# Patient Record
Sex: Female | Born: 1950 | Race: White | Hispanic: No | Marital: Married | Smoking: Former smoker
Health system: Southern US, Community
[De-identification: ages and names within clinical notes are randomized; demographics above are authoritative.]

## PROBLEM LIST (undated history)

## (undated) DIAGNOSIS — E079 Disorder of thyroid, unspecified: Secondary | ICD-10-CM

## (undated) DIAGNOSIS — C50919 Malignant neoplasm of unspecified site of unspecified female breast: Secondary | ICD-10-CM

## (undated) DIAGNOSIS — Z923 Personal history of irradiation: Secondary | ICD-10-CM

## (undated) DIAGNOSIS — Z9221 Personal history of antineoplastic chemotherapy: Secondary | ICD-10-CM

## (undated) HISTORY — DX: Malignant neoplasm of unspecified site of unspecified female breast: C50.919

## (undated) HISTORY — DX: Disorder of thyroid, unspecified: E07.9

---

## 2004-04-04 ENCOUNTER — Encounter (INDEPENDENT_AMBULATORY_CARE_PROVIDER_SITE_OTHER): Payer: Self-pay | Admitting: Specialist

## 2004-04-04 ENCOUNTER — Encounter: Admission: RE | Admit: 2004-04-04 | Discharge: 2004-04-04 | Payer: Self-pay | Admitting: Family Medicine

## 2004-04-07 DIAGNOSIS — C50919 Malignant neoplasm of unspecified site of unspecified female breast: Secondary | ICD-10-CM

## 2004-04-07 HISTORY — PX: BREAST LUMPECTOMY: SHX2

## 2004-04-07 HISTORY — DX: Malignant neoplasm of unspecified site of unspecified female breast: C50.919

## 2004-04-07 HISTORY — PX: CYSTECTOMY: SUR359

## 2004-04-15 ENCOUNTER — Encounter: Admission: RE | Admit: 2004-04-15 | Discharge: 2004-04-15 | Payer: Self-pay | Admitting: General Surgery

## 2004-04-16 ENCOUNTER — Encounter: Admission: RE | Admit: 2004-04-16 | Discharge: 2004-04-16 | Payer: Self-pay | Admitting: General Surgery

## 2004-04-17 ENCOUNTER — Ambulatory Visit (HOSPITAL_COMMUNITY): Admission: RE | Admit: 2004-04-17 | Discharge: 2004-04-17 | Payer: Self-pay | Admitting: General Surgery

## 2004-04-17 ENCOUNTER — Ambulatory Visit (HOSPITAL_BASED_OUTPATIENT_CLINIC_OR_DEPARTMENT_OTHER): Admission: RE | Admit: 2004-04-17 | Discharge: 2004-04-17 | Payer: Self-pay | Admitting: General Surgery

## 2004-04-17 ENCOUNTER — Encounter (INDEPENDENT_AMBULATORY_CARE_PROVIDER_SITE_OTHER): Payer: Self-pay | Admitting: *Deleted

## 2004-05-06 ENCOUNTER — Ambulatory Visit (HOSPITAL_BASED_OUTPATIENT_CLINIC_OR_DEPARTMENT_OTHER): Admission: RE | Admit: 2004-05-06 | Discharge: 2004-05-06 | Payer: Self-pay | Admitting: General Surgery

## 2004-05-06 ENCOUNTER — Encounter (INDEPENDENT_AMBULATORY_CARE_PROVIDER_SITE_OTHER): Payer: Self-pay | Admitting: *Deleted

## 2004-05-06 ENCOUNTER — Ambulatory Visit (HOSPITAL_COMMUNITY): Admission: RE | Admit: 2004-05-06 | Discharge: 2004-05-06 | Payer: Self-pay | Admitting: General Surgery

## 2004-05-14 ENCOUNTER — Ambulatory Visit (HOSPITAL_COMMUNITY): Admission: RE | Admit: 2004-05-14 | Discharge: 2004-05-14 | Payer: Self-pay | Admitting: Oncology

## 2004-05-15 ENCOUNTER — Ambulatory Visit (HOSPITAL_COMMUNITY): Admission: RE | Admit: 2004-05-15 | Discharge: 2004-05-15 | Payer: Self-pay | Admitting: Oncology

## 2004-05-16 ENCOUNTER — Ambulatory Visit (HOSPITAL_COMMUNITY): Admission: RE | Admit: 2004-05-16 | Discharge: 2004-05-16 | Payer: Self-pay | Admitting: Oncology

## 2004-05-17 ENCOUNTER — Ambulatory Visit (HOSPITAL_COMMUNITY): Admission: RE | Admit: 2004-05-17 | Discharge: 2004-05-17 | Payer: Self-pay | Admitting: Oncology

## 2004-05-17 ENCOUNTER — Encounter: Payer: Self-pay | Admitting: Cardiology

## 2004-05-17 ENCOUNTER — Ambulatory Visit: Payer: Self-pay | Admitting: Cardiology

## 2004-05-22 ENCOUNTER — Ambulatory Visit: Payer: Self-pay | Admitting: Oncology

## 2004-06-05 HISTORY — PX: OTHER SURGICAL HISTORY: SHX169

## 2004-07-02 ENCOUNTER — Ambulatory Visit: Admission: RE | Admit: 2004-07-02 | Discharge: 2004-07-02 | Payer: Self-pay | Admitting: Gynecology

## 2004-07-08 ENCOUNTER — Ambulatory Visit: Payer: Self-pay | Admitting: Oncology

## 2004-08-21 ENCOUNTER — Emergency Department (HOSPITAL_COMMUNITY): Admission: EM | Admit: 2004-08-21 | Discharge: 2004-08-21 | Payer: Self-pay | Admitting: Emergency Medicine

## 2004-08-26 ENCOUNTER — Ambulatory Visit: Payer: Self-pay | Admitting: Oncology

## 2004-09-11 ENCOUNTER — Ambulatory Visit: Admission: RE | Admit: 2004-09-11 | Discharge: 2004-10-15 | Payer: Self-pay | Admitting: Radiation Oncology

## 2004-10-14 ENCOUNTER — Ambulatory Visit: Payer: Self-pay | Admitting: Oncology

## 2004-10-24 ENCOUNTER — Ambulatory Visit (HOSPITAL_BASED_OUTPATIENT_CLINIC_OR_DEPARTMENT_OTHER): Admission: RE | Admit: 2004-10-24 | Discharge: 2004-10-24 | Payer: Self-pay | Admitting: General Surgery

## 2004-10-28 ENCOUNTER — Ambulatory Visit: Admission: RE | Admit: 2004-10-28 | Discharge: 2004-12-25 | Payer: Self-pay | Admitting: Radiation Oncology

## 2004-12-04 ENCOUNTER — Ambulatory Visit: Payer: Self-pay | Admitting: Oncology

## 2005-01-20 ENCOUNTER — Ambulatory Visit: Payer: Self-pay | Admitting: Oncology

## 2005-04-08 ENCOUNTER — Encounter: Admission: RE | Admit: 2005-04-08 | Discharge: 2005-04-08 | Payer: Self-pay | Admitting: Oncology

## 2005-05-29 ENCOUNTER — Ambulatory Visit: Payer: Self-pay | Admitting: Oncology

## 2005-07-15 ENCOUNTER — Ambulatory Visit: Payer: Self-pay | Admitting: Internal Medicine

## 2005-07-16 ENCOUNTER — Other Ambulatory Visit: Admission: RE | Admit: 2005-07-16 | Discharge: 2005-07-16 | Payer: Self-pay | Admitting: Obstetrics and Gynecology

## 2005-08-11 ENCOUNTER — Ambulatory Visit: Payer: Self-pay | Admitting: Internal Medicine

## 2005-08-12 ENCOUNTER — Ambulatory Visit: Payer: Self-pay | Admitting: Oncology

## 2005-08-21 LAB — HYPERCOAGULABLE PANEL, COMPREHENSIVE
AntiThromb III Func: 87 % (ref 75–120)
Anticardiolipin IgG: <7 [GPL'U] (ref <11)
Anticardiolipin IgM: <7 [MPL'U] (ref <10)
Beta-2 Glyco I IgG: <4 U/mL (ref <20)
Beta-2-Glycoprotein I IgA: <4 U/mL (ref <10)
Beta-2-Glycoprotein I IgM: <4 U/mL (ref <10)
Homocysteine: 17.1 umol/L — ABNORMAL HIGH (ref 4.0–15.4)
PTT Lupus Anticoagulant: 34.6 secs (ref 30.5–43.1)
Protein S Ag, Total: 86 % (ref 58–146)

## 2005-12-03 ENCOUNTER — Ambulatory Visit: Payer: Self-pay | Admitting: Oncology

## 2005-12-03 LAB — CBC WITH DIFFERENTIAL/PLATELET
Basophils Absolute: 0 10*3/uL (ref 0.0–0.1)
EOS%: 2.4 % (ref 0.0–7.0)
HGB: 13 g/dL (ref 11.6–15.9)
LYMPH%: 15.8 % (ref 14.0–48.0)
MCH: 32.9 pg (ref 26.0–34.0)
MCV: 96.9 fL (ref 81.0–101.0)
MONO%: 9.9 % (ref 0.0–13.0)
Platelets: 208 10*3/uL (ref 145–400)
RBC: 3.94 10*6/uL (ref 3.70–5.32)
RDW: 12.9 % (ref 11.3–14.5)

## 2005-12-03 LAB — COMPREHENSIVE METABOLIC PANEL
AST: 14 U/L (ref 0–37)
Albumin: 4.2 g/dL (ref 3.5–5.2)
Alkaline Phosphatase: 107 U/L (ref 39–117)
BUN: 17 mg/dL (ref 6–23)
Creatinine, Ser: 0.7 mg/dL (ref 0.40–1.20)
Glucose, Bld: 97 mg/dL (ref 70–99)
Potassium: 5 mEq/L (ref 3.5–5.3)
Total Bilirubin: 0.3 mg/dL (ref 0.3–1.2)

## 2006-04-10 ENCOUNTER — Encounter: Admission: RE | Admit: 2006-04-10 | Discharge: 2006-04-10 | Payer: Self-pay | Admitting: General Surgery

## 2006-04-10 ENCOUNTER — Encounter: Admission: RE | Admit: 2006-04-10 | Discharge: 2006-04-10 | Payer: Self-pay | Admitting: Oncology

## 2006-06-05 ENCOUNTER — Ambulatory Visit: Payer: Self-pay | Admitting: Oncology

## 2006-06-09 LAB — COMPREHENSIVE METABOLIC PANEL
AST: 16 U/L (ref 0–37)
Albumin: 4.1 g/dL (ref 3.5–5.2)
BUN: 17 mg/dL (ref 6–23)
CO2: 27 mEq/L (ref 19–32)
Calcium: 9 mg/dL (ref 8.4–10.5)
Chloride: 107 mEq/L (ref 96–112)
Potassium: 4.7 mEq/L (ref 3.5–5.3)

## 2006-06-09 LAB — CBC WITH DIFFERENTIAL/PLATELET
Basophils Absolute: 0 10*3/uL (ref 0.0–0.1)
EOS%: 2.5 % (ref 0.0–7.0)
Eosinophils Absolute: 0.1 10*3/uL (ref 0.0–0.5)
HGB: 13.4 g/dL (ref 11.6–15.9)
MCH: 33 pg (ref 26.0–34.0)
MONO#: 0.5 10*3/uL (ref 0.1–0.9)
NEUT#: 3.1 10*3/uL (ref 1.5–6.5)
RDW: 12.4 % (ref 11.3–14.5)
WBC: 5 10*3/uL (ref 3.9–10.0)
lymph#: 1.2 10*3/uL (ref 0.9–3.3)

## 2006-06-11 ENCOUNTER — Ambulatory Visit (HOSPITAL_COMMUNITY): Admission: RE | Admit: 2006-06-11 | Discharge: 2006-06-11 | Payer: Self-pay | Admitting: Oncology

## 2006-12-11 ENCOUNTER — Ambulatory Visit: Payer: Self-pay | Admitting: Oncology

## 2006-12-15 ENCOUNTER — Ambulatory Visit (HOSPITAL_COMMUNITY): Admission: RE | Admit: 2006-12-15 | Discharge: 2006-12-15 | Payer: Self-pay | Admitting: Oncology

## 2006-12-15 LAB — CBC WITH DIFFERENTIAL/PLATELET
EOS%: 1.1 % (ref 0.0–7.0)
Eosinophils Absolute: 0.1 10*3/uL (ref 0.0–0.5)
MCH: 33.5 pg (ref 26.0–34.0)
MCV: 95.1 fL (ref 81.0–101.0)
MONO%: 9.6 % (ref 0.0–13.0)
NEUT#: 4 10*3/uL (ref 1.5–6.5)
RBC: 3.88 10*6/uL (ref 3.70–5.32)
RDW: 12.8 % (ref 11.3–14.5)

## 2006-12-15 LAB — COMPREHENSIVE METABOLIC PANEL
AST: 17 U/L (ref 0–37)
Alkaline Phosphatase: 111 U/L (ref 39–117)
BUN: 15 mg/dL (ref 6–23)
Calcium: 9.8 mg/dL (ref 8.4–10.5)
Chloride: 105 mEq/L (ref 96–112)
Creatinine, Ser: 0.63 mg/dL (ref 0.40–1.20)

## 2007-04-12 ENCOUNTER — Encounter: Admission: RE | Admit: 2007-04-12 | Discharge: 2007-04-12 | Payer: Self-pay | Admitting: Surgery

## 2007-06-17 ENCOUNTER — Encounter: Admission: RE | Admit: 2007-06-17 | Discharge: 2007-06-17 | Payer: Self-pay | Admitting: Endocrinology

## 2007-11-06 ENCOUNTER — Ambulatory Visit: Payer: Self-pay | Admitting: Oncology

## 2007-11-15 LAB — CBC WITH DIFFERENTIAL/PLATELET
BASO%: 0.3 % (ref 0.0–2.0)
EOS%: 1.3 % (ref 0.0–7.0)
MCH: 32.5 pg (ref 26.0–34.0)
MCHC: 34.4 g/dL (ref 32.0–36.0)
RBC: 4.04 10*6/uL (ref 3.70–5.32)
RDW: 13.4 % (ref 11.3–14.5)
lymph#: 1.2 10*3/uL (ref 0.9–3.3)

## 2007-11-15 LAB — COMPREHENSIVE METABOLIC PANEL
ALT: 19 U/L (ref 0–35)
AST: 20 U/L (ref 0–37)
Albumin: 4.1 g/dL (ref 3.5–5.2)
Alkaline Phosphatase: 106 U/L (ref 39–117)
Calcium: 10 mg/dL (ref 8.4–10.5)
Chloride: 106 mEq/L (ref 96–112)
Potassium: 4.8 mEq/L (ref 3.5–5.3)

## 2008-01-03 ENCOUNTER — Encounter: Admission: RE | Admit: 2008-01-03 | Discharge: 2008-01-03 | Payer: Self-pay | Admitting: Endocrinology

## 2008-04-12 ENCOUNTER — Encounter: Admission: RE | Admit: 2008-04-12 | Discharge: 2008-04-12 | Payer: Self-pay | Admitting: General Surgery

## 2008-04-13 ENCOUNTER — Encounter: Admission: RE | Admit: 2008-04-13 | Discharge: 2008-04-13 | Payer: Self-pay | Admitting: Endocrinology

## 2008-04-19 ENCOUNTER — Encounter: Admission: RE | Admit: 2008-04-19 | Discharge: 2008-04-19 | Payer: Self-pay | Admitting: Surgery

## 2008-11-10 ENCOUNTER — Ambulatory Visit: Payer: Self-pay | Admitting: Oncology

## 2008-11-14 LAB — CBC WITH DIFFERENTIAL/PLATELET
Basophils Absolute: 0 10*3/uL (ref 0.0–0.1)
Eosinophils Absolute: 0.1 10*3/uL (ref 0.0–0.5)
HGB: 12.8 g/dL (ref 11.6–15.9)
MCV: 96.1 fL (ref 79.5–101.0)
MONO#: 0.8 10*3/uL (ref 0.1–0.9)
NEUT#: 3.7 10*3/uL (ref 1.5–6.5)
RDW: 13 % (ref 11.2–14.5)
lymph#: 1.5 10*3/uL (ref 0.9–3.3)

## 2008-11-14 LAB — COMPREHENSIVE METABOLIC PANEL
Albumin: 4.1 g/dL (ref 3.5–5.2)
BUN: 16 mg/dL (ref 6–23)
Calcium: 9.3 mg/dL (ref 8.4–10.5)
Chloride: 105 mEq/L (ref 96–112)
Glucose, Bld: 112 mg/dL — ABNORMAL HIGH (ref 70–99)
Potassium: 4.5 mEq/L (ref 3.5–5.3)

## 2009-01-09 ENCOUNTER — Encounter: Admission: RE | Admit: 2009-01-09 | Discharge: 2009-01-09 | Payer: Self-pay | Admitting: Endocrinology

## 2009-01-25 ENCOUNTER — Ambulatory Visit: Payer: Self-pay | Admitting: Oncology

## 2009-01-29 LAB — BASIC METABOLIC PANEL
Calcium: 9 mg/dL (ref 8.4–10.5)
Chloride: 101 mEq/L (ref 96–112)
Creatinine, Ser: 0.56 mg/dL (ref 0.40–1.20)

## 2009-04-25 ENCOUNTER — Encounter: Admission: RE | Admit: 2009-04-25 | Discharge: 2009-04-25 | Payer: Self-pay | Admitting: Surgery

## 2009-07-27 ENCOUNTER — Ambulatory Visit: Payer: Self-pay | Admitting: Oncology

## 2009-07-30 LAB — BASIC METABOLIC PANEL
BUN: 15 mg/dL (ref 6–23)
Chloride: 102 mEq/L (ref 96–112)
Creatinine, Ser: 0.61 mg/dL (ref 0.40–1.20)

## 2009-07-30 LAB — CBC WITH DIFFERENTIAL/PLATELET
BASO%: 0.5 % (ref 0.0–2.0)
EOS%: 1.7 % (ref 0.0–7.0)
HCT: 39.3 % (ref 34.8–46.6)
LYMPH%: 21 % (ref 14.0–49.7)
MCH: 32.1 pg (ref 25.1–34.0)
MCHC: 33.1 g/dL (ref 31.5–36.0)
NEUT%: 66 % (ref 38.4–76.8)
Platelets: 229 10*3/uL (ref 145–400)

## 2009-11-09 ENCOUNTER — Ambulatory Visit: Payer: Self-pay | Admitting: Oncology

## 2009-11-13 LAB — CBC WITH DIFFERENTIAL/PLATELET
EOS%: 2.5 % (ref 0.0–7.0)
Eosinophils Absolute: 0.2 10*3/uL (ref 0.0–0.5)
MCH: 33 pg (ref 25.1–34.0)
MCV: 96.4 fL (ref 79.5–101.0)
MONO%: 9.8 % (ref 0.0–14.0)
NEUT#: 4.2 10*3/uL (ref 1.5–6.5)
RBC: 3.82 10*6/uL (ref 3.70–5.45)
RDW: 13.3 % (ref 11.2–14.5)

## 2009-11-14 LAB — COMPREHENSIVE METABOLIC PANEL
ALT: 15 U/L (ref 0–35)
AST: 16 U/L (ref 0–37)
Albumin: 4 g/dL (ref 3.5–5.2)
Alkaline Phosphatase: 92 U/L (ref 39–117)
Potassium: 3.7 mEq/L (ref 3.5–5.3)
Sodium: 143 mEq/L (ref 135–145)
Total Protein: 7 g/dL (ref 6.0–8.3)

## 2010-01-16 ENCOUNTER — Encounter: Admission: RE | Admit: 2010-01-16 | Discharge: 2010-01-16 | Payer: Self-pay | Admitting: Endocrinology

## 2010-02-01 ENCOUNTER — Ambulatory Visit: Payer: Self-pay | Admitting: Oncology

## 2010-02-05 LAB — CBC WITH DIFFERENTIAL/PLATELET
BASO%: 0.4 % (ref 0.0–2.0)
EOS%: 2.7 % (ref 0.0–7.0)
Eosinophils Absolute: 0.2 10*3/uL (ref 0.0–0.5)
MCHC: 33.2 g/dL (ref 31.5–36.0)
MCV: 95.6 fL (ref 79.5–101.0)
MONO%: 9.4 % (ref 0.0–14.0)
NEUT#: 4.8 10*3/uL (ref 1.5–6.5)
RBC: 4.09 10*6/uL (ref 3.70–5.45)
RDW: 13.3 % (ref 11.2–14.5)
WBC: 7 10*3/uL (ref 3.9–10.3)

## 2010-02-06 LAB — COMPREHENSIVE METABOLIC PANEL
BUN: 14 mg/dL (ref 6–23)
Calcium: 9.8 mg/dL (ref 8.4–10.5)
Creatinine, Ser: 0.62 mg/dL (ref 0.40–1.20)
Glucose, Bld: 93 mg/dL (ref 70–99)
Potassium: 3.2 mEq/L — ABNORMAL LOW (ref 3.5–5.3)
Sodium: 140 mEq/L (ref 135–145)
Total Bilirubin: 0.7 mg/dL (ref 0.3–1.2)

## 2010-02-06 LAB — VITAMIN D 25 HYDROXY (VIT D DEFICIENCY, FRACTURES): Vit D, 25-Hydroxy: 43 ng/mL (ref 30–89)

## 2010-02-06 LAB — CANCER ANTIGEN 27.29: CA 27.29: 22 U/mL (ref 0–39)

## 2010-05-01 ENCOUNTER — Encounter
Admission: RE | Admit: 2010-05-01 | Discharge: 2010-05-01 | Payer: Self-pay | Source: Home / Self Care | Attending: Oncology | Admitting: Oncology

## 2010-08-05 ENCOUNTER — Other Ambulatory Visit: Payer: Self-pay | Admitting: Dermatology

## 2010-08-06 ENCOUNTER — Other Ambulatory Visit: Payer: Self-pay | Admitting: Oncology

## 2010-08-06 ENCOUNTER — Encounter (HOSPITAL_BASED_OUTPATIENT_CLINIC_OR_DEPARTMENT_OTHER): Payer: BC Managed Care – PPO | Admitting: Oncology

## 2010-08-06 DIAGNOSIS — M899 Disorder of bone, unspecified: Secondary | ICD-10-CM

## 2010-08-06 DIAGNOSIS — Z79899 Other long term (current) drug therapy: Secondary | ICD-10-CM

## 2010-08-06 DIAGNOSIS — C50419 Malignant neoplasm of upper-outer quadrant of unspecified female breast: Secondary | ICD-10-CM

## 2010-08-06 DIAGNOSIS — Z171 Estrogen receptor negative status [ER-]: Secondary | ICD-10-CM

## 2010-08-06 DIAGNOSIS — M810 Age-related osteoporosis without current pathological fracture: Secondary | ICD-10-CM

## 2010-08-06 LAB — BASIC METABOLIC PANEL - CANCER CENTER ONLY
Potassium: 4.6 mEq/L (ref 3.3–4.7)
Sodium: 145 mEq/L (ref 128–145)

## 2010-08-23 NOTE — Op Note (Signed)
NAME:  Julie Cortez, Julie Cortez NO.:  [id number]   MEDICAL RECORD NO.:  [id number]          PATIENT TYPE:  AMB   LOCATION:  DSC                          FACILITY:  [facility]   PHYSICIAN:  Rose Phi. Maple Hudson, M.D.   DATE OF BIRTH:  1951-03-25   DATE OF PROCEDURE:  10/24/2004  DATE OF DISCHARGE:                                 OPERATIVE REPORT   PREOPERATIVE DIAGNOSIS:  Carcinoma of the right breast.   POSTOPERATIVE DIAGNOSIS:  Carcinoma of the right breast.   OPERATION:  Removal of Port-A-Cath.   SURGEON:  Rose Phi. Maple Hudson, M.D.   ANESTHESIA:  Local.   OPERATIVE PROCEDURE:  The patient placed on the operating table and the left  upper chest prepped and draped in usual fashion. After anesthetizing the  area with 1% Xylocaine and adrenaline, a short transverse incision was made  overlying the port. The catheter was exposed, grasped and removed from the  subclavian vein. There was no bleeding.   With traction on the catheter, I divided the two sutures holding it in place  and then removed the port.   Again with no bleeding, the incision was closed with a subcuticular 4-0  Monocryl and Steri-Strips. Dressing applied. The patient then allowed to go  home.       PRY/MEDQ  D:  10/24/2004  T:  10/24/2004  Job:  [id number]

## 2010-08-23 NOTE — Consult Note (Signed)
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  She also had a Pap smear showing atypical squamous cells of uncertain  significance.  Reflexed HPV typing was not done.   The patient has had an uncomplicated postoperative course.  She denies any  abdominal pain or any other symptoms associated with the surgery.  She is  currently receiving chemotherapy under the direction of Dr. Darnelle Catalan.   PHYSICAL EXAMINATION:  VITAL SIGNS:  Weight 132 pounds, blood pressure  100/76, pulse 120.  GENERAL:  The patient is a healthy white female in no acute distress.  ABDOMEN:  Soft and nontender.  All Steri-Strips are removed, and all  incisions are found to be healing well.   IMPRESSION:  Excellent postoperative recovery.  She can return to full  levels of activity.  She should return in 6 months to have repeat Pap smear  for further evaluation of this single atypical squamous cell report.  She  will return to the care of Dr. Darnelle Catalan for continuing chemotherapy for her  breast cancer.      DC/MEDQ  D:  07/02/2004  T:  07/02/2004  Job:  [id number]   cc:   Valentino Hue. Magrinat, M.D.  [address] [facility]    [place] [address]  Fax: [phone]   L. Lupe Carney, M.D.  [address] [place]  [place] [address]  Fax: [phone]   Telford Nab, R.N.  [address]  [place], [place] [address]

## 2010-09-04 ENCOUNTER — Encounter (INDEPENDENT_AMBULATORY_CARE_PROVIDER_SITE_OTHER): Payer: Self-pay | Admitting: Surgery

## 2010-11-19 ENCOUNTER — Other Ambulatory Visit: Payer: Self-pay | Admitting: Oncology

## 2010-11-19 ENCOUNTER — Encounter (HOSPITAL_BASED_OUTPATIENT_CLINIC_OR_DEPARTMENT_OTHER): Payer: BC Managed Care – PPO | Admitting: Oncology

## 2010-11-19 DIAGNOSIS — C50419 Malignant neoplasm of upper-outer quadrant of unspecified female breast: Secondary | ICD-10-CM

## 2010-11-19 LAB — COMPREHENSIVE METABOLIC PANEL
CO2: 32 mEq/L (ref 19–32)
Glucose, Bld: 101 mg/dL — ABNORMAL HIGH (ref 70–99)
Sodium: 139 mEq/L (ref 135–145)
Total Bilirubin: 0.2 mg/dL — ABNORMAL LOW (ref 0.3–1.2)
Total Protein: 7.4 g/dL (ref 6.0–8.3)

## 2010-11-19 LAB — CBC WITH DIFFERENTIAL/PLATELET
Basophils Absolute: 0 10*3/uL (ref 0.0–0.1)
Eosinophils Absolute: 0.1 10*3/uL (ref 0.0–0.5)
HCT: 37.6 % (ref 34.8–46.6)
HGB: 12.8 g/dL (ref 11.6–15.9)
LYMPH%: 25 % (ref 14.0–49.7)
MONO#: 0.6 10*3/uL (ref 0.1–0.9)
NEUT#: 3.7 10*3/uL (ref 1.5–6.5)
NEUT%: 62 % (ref 38.4–76.8)
Platelets: 208 10*3/uL (ref 145–400)
RBC: 3.92 10*6/uL (ref 3.70–5.45)
WBC: 6 10*3/uL (ref 3.9–10.3)

## 2010-11-20 LAB — VITAMIN D 25 HYDROXY (VIT D DEFICIENCY, FRACTURES): Vit D, 25-Hydroxy: 37 ng/mL (ref 30–89)

## 2010-11-20 LAB — CANCER ANTIGEN 27.29: CA 27.29: 27 U/mL (ref 0–39)

## 2010-11-26 ENCOUNTER — Encounter (HOSPITAL_BASED_OUTPATIENT_CLINIC_OR_DEPARTMENT_OTHER): Payer: BC Managed Care – PPO | Admitting: Oncology

## 2010-11-26 DIAGNOSIS — M899 Disorder of bone, unspecified: Secondary | ICD-10-CM

## 2010-11-26 DIAGNOSIS — Z171 Estrogen receptor negative status [ER-]: Secondary | ICD-10-CM

## 2010-11-26 DIAGNOSIS — M810 Age-related osteoporosis without current pathological fracture: Secondary | ICD-10-CM

## 2010-11-26 DIAGNOSIS — C50419 Malignant neoplasm of upper-outer quadrant of unspecified female breast: Secondary | ICD-10-CM

## 2011-01-14 ENCOUNTER — Other Ambulatory Visit: Payer: Self-pay | Admitting: Endocrinology

## 2011-01-14 DIAGNOSIS — E049 Nontoxic goiter, unspecified: Secondary | ICD-10-CM

## 2011-01-28 ENCOUNTER — Ambulatory Visit
Admission: RE | Admit: 2011-01-28 | Discharge: 2011-01-28 | Disposition: A | Discharge status: Home / Self Care | Payer: BC Managed Care – PPO | Source: Ambulatory Visit | Attending: Endocrinology | Admitting: Endocrinology

## 2011-01-28 DIAGNOSIS — E049 Nontoxic goiter, unspecified: Secondary | ICD-10-CM

## 2011-03-24 ENCOUNTER — Other Ambulatory Visit: Payer: Self-pay | Admitting: Oncology

## 2011-03-24 DIAGNOSIS — Z9889 Other specified postprocedural states: Secondary | ICD-10-CM

## 2011-03-24 DIAGNOSIS — C50919 Malignant neoplasm of unspecified site of unspecified female breast: Secondary | ICD-10-CM

## 2011-05-01 ENCOUNTER — Encounter (INDEPENDENT_AMBULATORY_CARE_PROVIDER_SITE_OTHER): Payer: Self-pay | Admitting: Surgery

## 2011-05-05 ENCOUNTER — Ambulatory Visit
Admission: RE | Admit: 2011-05-05 | Discharge: 2011-05-05 | Disposition: A | Discharge status: Home / Self Care | Payer: BC Managed Care – PPO | Source: Ambulatory Visit | Attending: Oncology | Admitting: Oncology

## 2011-05-05 DIAGNOSIS — C50919 Malignant neoplasm of unspecified site of unspecified female breast: Secondary | ICD-10-CM

## 2011-05-05 DIAGNOSIS — Z9889 Other specified postprocedural states: Secondary | ICD-10-CM

## 2011-05-09 ENCOUNTER — Encounter (INDEPENDENT_AMBULATORY_CARE_PROVIDER_SITE_OTHER): Payer: Self-pay | Admitting: General Surgery

## 2011-05-09 DIAGNOSIS — C50911 Malignant neoplasm of unspecified site of right female breast: Secondary | ICD-10-CM | POA: Insufficient documentation

## 2011-05-10 ENCOUNTER — Telehealth: Payer: Self-pay | Admitting: Oncology

## 2011-05-10 NOTE — Telephone Encounter (Signed)
pt rtn call and confirmed appts for march and june 2013

## 2011-05-14 ENCOUNTER — Ambulatory Visit (INDEPENDENT_AMBULATORY_CARE_PROVIDER_SITE_OTHER): Payer: BC Managed Care – PPO | Admitting: Surgery

## 2011-05-14 ENCOUNTER — Encounter (INDEPENDENT_AMBULATORY_CARE_PROVIDER_SITE_OTHER): Payer: Self-pay | Admitting: Surgery

## 2011-05-14 VITALS — BP 120/82 | HR 80 | Temp 97.2°F | Resp 16 | Ht 63.75 in | Wt 162.6 lb

## 2011-05-14 DIAGNOSIS — Z853 Personal history of malignant neoplasm of breast: Secondary | ICD-10-CM

## 2011-05-14 NOTE — Progress Notes (Signed)
NAME: Julie Cortez       DOB: 1950-08-11           DATE: 05/14/2011       MRN: [id number]   Julie Cortez is a 61 y.o.Marland Kitchenfemale who presents for routine followup of her Right breast cancer, stage II diagnosed in 2005 and treated with lumpectomy and radiation. She has no problems or concerns on either side.  PFSH: She has had no significant changes since the last visit here.  ROS: There have been no significant changes since the last visit here  EXAM: General: The patient is alert, oriented, generally healty appearing, NAD. Mood and affect are normal.  Breasts:  Right breast shows deformity and loss of tissue at the lumpectomy site but no evidence of a recurrence. The left side is normal  Lymphatics: She has no axillary or supraclavicular adenopathy on either side.  Extremities: Full ROM of the surgical side with no lymphedema noted.  Data Reviewed: Mammogram last week is negative  Impression: Doing well, with no evidence of recurrent cancer or new cancer  Plan: Will continue to follow up on an annual basis here.

## 2011-07-01 ENCOUNTER — Other Ambulatory Visit: Payer: Self-pay | Admitting: Medical Oncology

## 2011-07-01 ENCOUNTER — Other Ambulatory Visit: Payer: Self-pay | Admitting: Oncology

## 2011-07-01 ENCOUNTER — Ambulatory Visit (HOSPITAL_BASED_OUTPATIENT_CLINIC_OR_DEPARTMENT_OTHER): Payer: BC Managed Care – PPO

## 2011-07-01 VITALS — BP 104/69 | HR 79 | Temp 97.0°F

## 2011-07-01 DIAGNOSIS — Z853 Personal history of malignant neoplasm of breast: Secondary | ICD-10-CM

## 2011-07-01 MED ORDER — SODIUM CHLORIDE 0.9 % IV SOLN
Freq: Once | INTRAVENOUS | Status: AC
  Administered 2011-07-01: 16:00:00 via INTRAVENOUS

## 2011-07-01 MED ORDER — ZOLEDRONIC ACID 4 MG/100ML IV SOLN
4.0000 mg | Freq: Once | INTRAVENOUS | Status: AC
  Administered 2011-07-01: 4 mg via INTRAVENOUS
  Filled 2011-07-01: qty 100

## 2011-07-15 ENCOUNTER — Other Ambulatory Visit: Payer: Self-pay | Admitting: Gynecology

## 2011-08-19 ENCOUNTER — Other Ambulatory Visit: Payer: Self-pay | Admitting: Dermatology

## 2011-09-16 ENCOUNTER — Other Ambulatory Visit (HOSPITAL_BASED_OUTPATIENT_CLINIC_OR_DEPARTMENT_OTHER): Payer: BC Managed Care – PPO | Admitting: Lab

## 2011-09-16 DIAGNOSIS — Z171 Estrogen receptor negative status [ER-]: Secondary | ICD-10-CM

## 2011-09-16 DIAGNOSIS — M899 Disorder of bone, unspecified: Secondary | ICD-10-CM

## 2011-09-16 DIAGNOSIS — M810 Age-related osteoporosis without current pathological fracture: Secondary | ICD-10-CM

## 2011-09-16 DIAGNOSIS — C50419 Malignant neoplasm of upper-outer quadrant of unspecified female breast: Secondary | ICD-10-CM

## 2011-09-16 LAB — CBC WITH DIFFERENTIAL/PLATELET
BASO%: 0.7 % (ref 0.0–2.0)
Basophils Absolute: 0 10*3/uL (ref 0.0–0.1)
EOS%: 2.9 % (ref 0.0–7.0)
HCT: 37.5 % (ref 34.8–46.6)
HGB: 12.4 g/dL (ref 11.6–15.9)
MCH: 32.3 pg (ref 25.1–34.0)
MCHC: 33 g/dL (ref 31.5–36.0)
MCV: 98.1 fL (ref 79.5–101.0)
MONO%: 9.1 % (ref 0.0–14.0)
NEUT%: 65.7 % (ref 38.4–76.8)
RDW: 13.3 % (ref 11.2–14.5)

## 2011-09-16 LAB — COMPREHENSIVE METABOLIC PANEL
ALT: 20 U/L (ref 0–35)
AST: 20 U/L (ref 0–37)
Alkaline Phosphatase: 96 U/L (ref 39–117)
BUN: 13 mg/dL (ref 6–23)
Creatinine, Ser: 0.54 mg/dL (ref 0.50–1.10)
Total Bilirubin: 0.2 mg/dL — ABNORMAL LOW (ref 0.3–1.2)

## 2011-09-23 ENCOUNTER — Ambulatory Visit (HOSPITAL_BASED_OUTPATIENT_CLINIC_OR_DEPARTMENT_OTHER): Payer: BC Managed Care – PPO | Admitting: Oncology

## 2011-09-23 ENCOUNTER — Telehealth: Payer: Self-pay | Admitting: Oncology

## 2011-09-23 VITALS — BP 113/70 | HR 71 | Temp 97.6°F | Ht 63.75 in | Wt 161.3 lb

## 2011-09-23 DIAGNOSIS — E559 Vitamin D deficiency, unspecified: Secondary | ICD-10-CM

## 2011-09-23 DIAGNOSIS — Z853 Personal history of malignant neoplasm of breast: Secondary | ICD-10-CM

## 2011-09-23 NOTE — Telephone Encounter (Signed)
lmonvm adviisng the pt of her June 2014 appts °

## 2011-09-23 NOTE — Progress Notes (Signed)
ID: Julie Cortez   DOB: May 28, 1950  MR#: [id number]  CSN#:[id number]  HISTORY OF PRESENT ILLNESS: Julie Cortez had neglected her health maintenance and she had not had a mammogram  before Christmas 2005, when she noted a mass in her right breast. She eventually brought this to Dr. Quita Skye attention (she did not have a primary doctor at that time, but Dr. Clovis Riley takes care of her husband, Larita Fife) and he set her up for mammography on April 04, 2004. She had a palpable mass at the 12 o'clock position in the right breast and by mammogram, this was spiculated without a malignant type microcalcification. The ultrasound showed the mass to be approximately 2.1 cm. Biopsy was performed the same day and showed ([id number]) invasive mammary carcinoma, which was ER/PR and HER-2 negative.    With this information, the patient was referred to Dr. Francina Ames who on January 11 proceed to a right lumpectomy with sentinel lymph node biopsy. That report ([id number]) shows a multifocal tumor, the largest mass being 4.2 cm, two other masses being 1.2 and 0.7 mm, grade 3, with  0 of 2 lymph nodes involved. There was extensive lymphovascular invasion and the margins were positive.  Accordingly, Dr. Maple Hudson went back on January 30th and the final margins were negative, but still close at 1 mm superiorly.  There was also extensive LVI noted again in this sample. Julie Cortez's subsequent history is as detailed below.  INTERVAL HISTORY: She returns today with her husband Larita Fife for followup of her remote breast cancer. The interval history is chiefly significant for her oldest daughter planning to get married later this year. 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  REVIEW OF SYSTEMS: Jasalyn is doing very well from a breast cancer point of view, and there are no symptoms suggestive of disease activity. She does have her psoriasis issues, and [facility] follows her for that. Her hypothyroidism and other medical issues  are dealt with by Dr. Clovis Riley. Sometimes when she walks upstairs a lot in the hospital, or when she arrives in the car for long time, she gets pain in the right hip. This disappears with activity. A detailed review of systems today was entirely negative. She is exercising regularly, chiefly by walking.  PAST MEDICAL HISTORY: Past Medical History  Diagnosis Date  . Thyroid disease     goiter  . Cancer 2006    right Breast  Significant for status post Port placement, psoriasis, history of postpartum pulmonary embolus, history of tobacco abuse, history of Clomid use in the late 70s, history of anxiety /depression and history of left breast cyst aspiration.   PAST SURGICAL HISTORY: Past Surgical History  Procedure Date  . Laproscopic cyst removal March 2006  . Cystectomy 2006  . Breast lumpectomy 2006    right breast    FAMILY HISTORY Family History  Problem Relation Age of Onset  . Cancer Father     Lung  . Cancer Mother     brain  Her father died from widely disseminated cancer at the age of 45. They really do not know what the primary site was. It was quite widespread at the time of diagnosis and he died within six days of diagnosis. Her mother had superficial bladder cancer and then developed a central nervous system non-Hodgkin's lymphoma.  She died within six months of diagnosis.  The patient has one brother and one sister.  There is no history of breast cancer in the family and no history of  ovarian cancer in the family.  GYNECOLOGIC HISTORY: She is G3, P3. First delivery age 18, last menstrual period 18. She did not have significant problems with hot flashes and never took hormone replacement therapy.   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  Her husband, Larita Fife, is present today. He owns and runs a Loss adjuster, chartered. Their three children are Florentina Addison, who lives in  [place] and is in Airline pilot; Fallon, who lives in [place] and works for [employer]; and [place] [employer], their son 21 who is junior in [place] and plays [employer] for the school.  She is a member of [employer].    ADVANCED DIRECTIVES: in place  HEALTH MAINTENANCE: History  Substance Use Topics  . Smoking status: Former Smoker    Quit date: 04/07/2004  . Smokeless tobacco: Not on file  . Alcohol Use: Yes     Colonoscopy:  PAP:  Bone density:  Lipid panel:  No Known Allergies  Current Outpatient Prescriptions  Medication Sig Dispense Refill  . acitretin (SORIATANE) 25 MG capsule Take 25 mg by mouth daily before breakfast.      . acyclovir (ZOVIRAX) 800 MG tablet Take 800 mg by mouth 2 (two) times daily.      Marland Kitchen CALCIUM-VITAMIN D PO Take 500 mg by mouth 2 (two) times daily.        . ciprofloxacin (CIPRO) 750 MG tablet Take 750 mg by mouth 2 (two) times daily.      Marland Kitchen levothyroxine (SYNTHROID, LEVOTHROID) 50 MCG tablet Take 50 mcg by mouth daily.        . methotrexate (RHEUMATREX) 2.5 MG tablet Take 2.5 mg by mouth 3 (three) times a week. Caution:Chemotherapy. Protect from light.      . Zoledronic Acid (ZOMETA IV) Inject into the vein. Twice a year         OBJECTIVE: Middle-aged white woman who appears well Filed Vitals:   09/23/11 1530  BP: 113/70  Pulse: 71  Temp: 97.6 F (36.4 C)     Body mass index is 27.90 kg/(m^2).    ECOG FS: 0 Sclerae unicteric Oropharynx clear No cervical or supraclavicular adenopathy Lungs no rales or rhonchi Heart regular rate and rhythm Abd benign MSK no focal spinal tenderness, no peripheral edema Neuro: nonfocal Breasts: The right breast is status post lumpectomy and radiation; there is no evidence of local recurrence. The left breast is unremarkable  LAB RESULTS: Lab Results  Component Value Date   WBC 5.7 09/16/2011   NEUTROABS 3.7 09/16/2011   HGB 12.4 09/16/2011   HCT 37.5 09/16/2011   MCV 98.1 09/16/2011   PLT 249 09/16/2011       Chemistry      Component Value Date/Time   NA 143 09/16/2011 1536   NA 145 08/06/2010 1517   K 3.4* 09/16/2011 1536   K 4.6 08/06/2010 1517   CL 106 09/16/2011 1536   CL 99 08/06/2010 1517   CO2 30 09/16/2011 1536   CO2 31 08/06/2010 1517   BUN 13 09/16/2011 1536   BUN 17 08/06/2010 1517   CREATININE 0.54 09/16/2011 1536   CREATININE 0.6 08/06/2010 1517      Component Value Date/Time   CALCIUM 9.0 09/16/2011 1536   CALCIUM 9.6 08/06/2010 1517   ALKPHOS 96 09/16/2011 1536   AST 20 09/16/2011 1536   ALT 20 09/16/2011 1536   BILITOT 0.2* 09/16/2011 1536  Lab Results  Component Value Date   LABCA2 25 09/16/2011    No components found with this basename: [id number]    No results found for this basename: INR:1;PROTIME:1 in the last 168 hours  Urinalysis No results found for this basename: colorurine, appearanceur, labspec, phurine, glucoseu, hgbur, bilirubinur, ketonesur, proteinur, urobilinogen, nitrite, leukocytesur    STUDIES: Mammography January 2013 was unremarkable. Bone density will be repeated January of next year.  ASSESSMENT: 61 y.o. 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    PLAN: We have been giving her zoledronic acid, but at this point I think it would be prudent to wait until the results of the upcoming bone density next year, so we are not scheduling that until we get that result. As far as the right hip discomfort is concerned I have suggested range of motion exercises. She will also discuss that with a trainer's at the gym where she will be going. She would like to continue to see me on a yearly basis and of course that is agreeable. We will do lab and physical exam only at the next visit. Of course she will have her  mammography again at January of next year. She knows to call for any other issues that may develop before the next visit.   Deno Sida C    09/23/2011

## 2011-10-14 ENCOUNTER — Other Ambulatory Visit: Payer: Self-pay | Admitting: *Deleted

## 2011-10-14 ENCOUNTER — Telehealth: Payer: Self-pay | Admitting: *Deleted

## 2011-10-14 DIAGNOSIS — Z853 Personal history of malignant neoplasm of breast: Secondary | ICD-10-CM

## 2011-10-14 NOTE — Telephone Encounter (Signed)
called patient mammogram and bone density 05-06-2012 starting at 10:00am at the breast center

## 2012-04-23 ENCOUNTER — Encounter (INDEPENDENT_AMBULATORY_CARE_PROVIDER_SITE_OTHER): Payer: Self-pay | Admitting: Surgery

## 2012-05-06 ENCOUNTER — Ambulatory Visit
Admission: RE | Admit: 2012-05-06 | Discharge: 2012-05-06 | Disposition: A | Discharge status: Home / Self Care | Payer: BC Managed Care – PPO | Source: Ambulatory Visit | Attending: Oncology | Admitting: Oncology

## 2012-05-06 ENCOUNTER — Other Ambulatory Visit: Payer: Self-pay | Admitting: Oncology

## 2012-05-06 DIAGNOSIS — Z853 Personal history of malignant neoplasm of breast: Secondary | ICD-10-CM

## 2012-06-10 ENCOUNTER — Other Ambulatory Visit: Payer: Self-pay | Admitting: Endocrinology

## 2012-09-21 ENCOUNTER — Other Ambulatory Visit (HOSPITAL_BASED_OUTPATIENT_CLINIC_OR_DEPARTMENT_OTHER): Payer: BC Managed Care – PPO | Admitting: Lab

## 2012-09-21 DIAGNOSIS — Z853 Personal history of malignant neoplasm of breast: Secondary | ICD-10-CM

## 2012-09-21 DIAGNOSIS — E559 Vitamin D deficiency, unspecified: Secondary | ICD-10-CM

## 2012-09-21 LAB — CBC WITH DIFFERENTIAL/PLATELET
BASO%: 0.7 % (ref 0.0–2.0)
Eosinophils Absolute: 0.2 10*3/uL (ref 0.0–0.5)
MCHC: 33.8 g/dL (ref 31.5–36.0)
MONO#: 0.9 10*3/uL (ref 0.1–0.9)
NEUT#: 3.9 10*3/uL (ref 1.5–6.5)
RBC: 3.65 10*6/uL — ABNORMAL LOW (ref 3.70–5.45)
RDW: 15.4 % — ABNORMAL HIGH (ref 11.2–14.5)
WBC: 6.5 10*3/uL (ref 3.9–10.3)
lymph#: 1.4 10*3/uL (ref 0.9–3.3)

## 2012-09-21 LAB — COMPREHENSIVE METABOLIC PANEL (CC13)
ALT: 33 U/L (ref 0–55)
AST: 29 U/L (ref 5–34)
CO2: 28 mEq/L (ref 22–29)
Calcium: 9.2 mg/dL (ref 8.4–10.4)
Chloride: 106 mEq/L (ref 98–107)
Sodium: 142 mEq/L (ref 136–145)
Total Protein: 6.9 g/dL (ref 6.4–8.3)

## 2012-09-22 LAB — VITAMIN D 25 HYDROXY (VIT D DEFICIENCY, FRACTURES): Vit D, 25-Hydroxy: 24 ng/mL — ABNORMAL LOW (ref 30–89)

## 2012-09-27 ENCOUNTER — Telehealth: Payer: Self-pay | Admitting: *Deleted

## 2012-09-27 ENCOUNTER — Ambulatory Visit (HOSPITAL_BASED_OUTPATIENT_CLINIC_OR_DEPARTMENT_OTHER): Payer: BC Managed Care – PPO | Admitting: Oncology

## 2012-09-27 VITALS — BP 106/69 | HR 85 | Temp 98.2°F | Resp 20 | Ht 63.75 in | Wt 147.5 lb

## 2012-09-27 DIAGNOSIS — Z853 Personal history of malignant neoplasm of breast: Secondary | ICD-10-CM

## 2012-09-27 NOTE — Progress Notes (Signed)
ID: Julie Cortez   DOB: [birth year]  MR#: [id number]  CSN#:[id number]  PCP: Julie Stabile, MD GYN: SU:  OTHER MD: Julie Cortez   HISTORY OF PRESENT ILLNESS: Julie Cortez had neglected her health maintenance and she had not had a mammogram  before Christmas 2005, when she noted a mass in her right breast. She eventually brought this to Julie Cortez attention (she did not have a primary doctor at that time, but Julie Cortez takes care of her husband, Julie Cortez) and he set her up for mammography on April 04, 2004. She had a palpable mass at the 12 o'clock position in the right breast and by mammogram, this was spiculated without a malignant type microcalcification. The ultrasound showed the mass to be approximately 2.1 cm. Biopsy was performed the same day and showed ([id number]) invasive mammary carcinoma, which was ER/PR and HER-2 negative.    With this information, the patient was referred to Julie Cortez who on January 11 proceed to a right lumpectomy with sentinel lymph node biopsy. That report ([id number]) shows a multifocal tumor, the largest mass being 4.2 cm, two other masses being 1.2 and 0.7 mm, grade 3, with  0 of 2 lymph nodes involved. There was extensive lymphovascular invasion and the margins were positive.  Accordingly, Julie Cortez went back on January 30th and the final margins were negative, but still close at 1 mm superiorly.  There was also extensive LVI noted again in this sample. Julie Cortez's subsequent history is as detailed below.  INTERVAL HISTORY: She returns today for followup of her remote breast cancer. . She is expecting her first grandchild. She is excercising 5 days a week at the [facility].   REVIEW OF SYSTEMS: [name] feels terrific. Her only active problem is psoriasis, and she is tolerating the MTX well, with close dermatologic follow-up. She is pleased at having brought her weight under control and is trying to decide on 135-140 as her ultimate goal. A detailed  review of systems today was otehrwise negative  PAST MEDICAL HISTORY: Past Medical History  Diagnosis Date  . Thyroid disease     goiter  . Cancer 2006    right Breast  Significant for status post Port placement, psoriasis, history of postpartum pulmonary embolus, history of tobacco abuse, history of Clomid use in the late 70s, history of anxiety /depression and history of left breast cyst aspiration.   PAST SURGICAL HISTORY: Past Surgical History  Procedure Laterality Date  . Laproscopic cyst removal  March 2006  . Cystectomy  2006  . Breast lumpectomy  2006    right breast    FAMILY HISTORY Family History  Problem Relation Age of Onset  . Cancer Father     Lung  . Cancer Mother     brain  Her father died from widely disseminated cancer at the age of 31. They really do not know what the primary site was. It was quite widespread at the time of diagnosis and he died within six days of diagnosis. Her mother had superficial bladder cancer and then developed a central nervous system non-Hodgkin's lymphoma.  She died within six months of diagnosis.  The patient has one brother and one sister.  There is no history of breast cancer in the family and no history of ovarian cancer in the family.  GYNECOLOGIC HISTORY: She is G3, P3. First delivery age 60, last menstrual period 53. She did not have significant problems with hot flashes and never took hormone replacement therapy.  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  Her husband, Julie Cortez, is present today. He owns and runs a Loss adjuster, chartered. Their three children are Julie Cortez, who lives in [place] and is in Airline pilot; Julie Cortez, who lives in [place] and works for [employer]; and Julie Cortez, their son 21 who is junior in [place] and plays [employer] for the school.  She is a member of [employer].    ADVANCED DIRECTIVES: in  place  HEALTH MAINTENANCE: History  Substance Use Topics  . Smoking status: Former Smoker    Quit date: 04/07/2004  . Smokeless tobacco: Not on file  . Alcohol Use: Yes     Colonoscopy:  PAP:  Bone density:  Lipid panel:  No Known Allergies  Current Outpatient Prescriptions  Medication Sig Dispense Refill  . acitretin (SORIATANE) 25 MG capsule Take 25 mg by mouth daily before breakfast.      . acyclovir (ZOVIRAX) 800 MG tablet Take 800 mg by mouth 2 (two) times daily.      Marland Kitchen CALCIUM-VITAMIN D PO Take 500 mg by mouth 2 (two) times daily.        . ciprofloxacin (CIPRO) 750 MG tablet Take 750 mg by mouth 2 (two) times daily.      Marland Kitchen levothyroxine (SYNTHROID, LEVOTHROID) 50 MCG tablet Take 50 mcg by mouth daily.        . methotrexate (RHEUMATREX) 2.5 MG tablet Take 2.5 mg by mouth 3 (three) times a week. Caution:Chemotherapy. Protect from light.      . Zoledronic Acid (ZOMETA IV) Inject into the vein. Twice a year        No current facility-administered medications for this visit.    OBJECTIVE: Middle-aged white woman who appears well Filed Vitals:   09/27/12 1140  BP: 106/69  Pulse: 85  Temp: 98.2 F (36.8 C)  Resp: 20     Body mass index is 25.53 kg/(m^2).    ECOG FS: 0 Sclerae unicteric Oropharynx clear No cervical or supraclavicular adenopathy Lungs no rales or rhonchi Heart regular rate and rhythm Abd benign MSK no focal spinal tenderness, no peripheral edema Neuro: nonfocal, well-oriented, positive affect Breasts: The right breast is status post lumpectomy and radiation; there is no evidence of local recurrence. The right axilla is benign. The left breast is unremarkable  LAB RESULTS: Lab Results  Component Value Date   WBC 6.5 09/21/2012   NEUTROABS 3.9 09/21/2012   HGB 12.5 09/21/2012   HCT 37.0 09/21/2012   MCV 101.5* 09/21/2012   PLT 213 09/21/2012      Chemistry      Component Value Date/Time   NA 142 09/21/2012 1503   NA 143 09/16/2011 1536   NA 145  08/06/2010 1517   K 3.8 09/21/2012 1503   K 3.4* 09/16/2011 1536   K 4.6 08/06/2010 1517   CL 106 09/21/2012 1503   CL 106 09/16/2011 1536   CL 99 08/06/2010 1517   CO2 28 09/21/2012 1503   CO2 30 09/16/2011 1536   CO2 31 08/06/2010 1517   BUN 12.9 09/21/2012 1503   BUN 13 09/16/2011 1536   BUN 17 08/06/2010 1517   CREATININE 0.7 09/21/2012 1503   CREATININE 0.54 09/16/2011 1536   CREATININE 0.6 08/06/2010 1517      Component Value Date/Time   CALCIUM 9.2 09/21/2012 1503   CALCIUM 9.0 09/16/2011 1536   CALCIUM 9.6 08/06/2010 1517  ALKPHOS 104 09/21/2012 1503   ALKPHOS 96 09/16/2011 1536   AST 29 09/21/2012 1503   AST 20 09/16/2011 1536   ALT 33 09/21/2012 1503   ALT 20 09/16/2011 1536   BILITOT 0.37 09/21/2012 1503   BILITOT 0.2* 09/16/2011 1536       Lab Results  Component Value Date   LABCA2 25 09/16/2011    No components found with this basename: [id number]    No results found for this basename: INR,  in the last 168 hours  Urinalysis No results found for this basename: colorurine,  appearanceur,  labspec,  phurine,  glucoseu,  hgbur,  bilirubinur,  ketonesur,  proteinur,  urobilinogen,  nitrite,  leukocytesur    STUDIES: Bone density this year was normal at the spine, showed moderate osteopenia at the femoral neck  ASSESSMENT: 62 y.o. 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    PLAN: I am comfortable holding off on further zometa and continuing her excellent exercise program together with vit D and calcium supplementation. I have ordered her JAN 2015 mammogram and set her up for return here in one year. Curl would like to be followed here indefinitely.) She knows to call for any problems that may develop before the next  visit.  Cecely Rengel C    09/27/2012

## 2012-09-27 NOTE — Telephone Encounter (Signed)
appts made and printed...td 

## 2012-09-28 NOTE — Addendum Note (Signed)
Addended by: Billey Co on: 09/28/2012 05:24 PM   Modules accepted: Orders

## 2013-02-15 ENCOUNTER — Ambulatory Visit
Admission: RE | Admit: 2013-02-15 | Discharge: 2013-02-15 | Disposition: A | Discharge status: Home / Self Care | Payer: BC Managed Care – PPO | Source: Ambulatory Visit | Attending: Endocrinology | Admitting: Endocrinology

## 2013-02-15 DIAGNOSIS — E049 Nontoxic goiter, unspecified: Secondary | ICD-10-CM

## 2013-02-16 ENCOUNTER — Other Ambulatory Visit: Payer: BC Managed Care – PPO

## 2013-05-09 ENCOUNTER — Ambulatory Visit
Admission: RE | Admit: 2013-05-09 | Discharge: 2013-05-09 | Disposition: A | Discharge status: Home / Self Care | Payer: BC Managed Care – PPO | Source: Ambulatory Visit | Attending: Oncology | Admitting: Oncology

## 2013-05-09 DIAGNOSIS — Z853 Personal history of malignant neoplasm of breast: Secondary | ICD-10-CM

## 2013-07-05 ENCOUNTER — Telehealth: Payer: Self-pay | Admitting: Oncology

## 2013-07-05 NOTE — Telephone Encounter (Signed)
, °

## 2013-09-20 ENCOUNTER — Other Ambulatory Visit: Payer: BC Managed Care – PPO

## 2013-09-27 ENCOUNTER — Encounter: Payer: BC Managed Care – PPO | Admitting: Oncology

## 2013-11-28 ENCOUNTER — Other Ambulatory Visit: Payer: BC Managed Care – PPO

## 2013-11-29 ENCOUNTER — Other Ambulatory Visit (HOSPITAL_BASED_OUTPATIENT_CLINIC_OR_DEPARTMENT_OTHER): Payer: BC Managed Care – PPO

## 2013-11-29 DIAGNOSIS — Z853 Personal history of malignant neoplasm of breast: Secondary | ICD-10-CM

## 2013-11-29 LAB — COMPREHENSIVE METABOLIC PANEL (CC13)
ALBUMIN: 3.4 g/dL — AB (ref 3.5–5.0)
ALT: 14 U/L (ref 0–55)
ANION GAP: 7 meq/L (ref 3–11)
AST: 20 U/L (ref 5–34)
Alkaline Phosphatase: 105 U/L (ref 40–150)
BUN: 12.1 mg/dL (ref 7.0–26.0)
CALCIUM: 8.9 mg/dL (ref 8.4–10.4)
CHLORIDE: 108 meq/L (ref 98–109)
CO2: 29 mEq/L (ref 22–29)
Creatinine: 0.7 mg/dL (ref 0.6–1.1)
GLUCOSE: 115 mg/dL (ref 70–140)
POTASSIUM: 3.8 meq/L (ref 3.5–5.1)
Sodium: 144 mEq/L (ref 136–145)
Total Bilirubin: 0.23 mg/dL (ref 0.20–1.20)
Total Protein: 6.8 g/dL (ref 6.4–8.3)

## 2013-11-29 LAB — CBC WITH DIFFERENTIAL/PLATELET
BASO%: 0.6 % (ref 0.0–2.0)
BASOS ABS: 0 10*3/uL (ref 0.0–0.1)
EOS ABS: 0.1 10*3/uL (ref 0.0–0.5)
EOS%: 1.4 % (ref 0.0–7.0)
HCT: 38.2 % (ref 34.8–46.6)
HEMOGLOBIN: 12.3 g/dL (ref 11.6–15.9)
LYMPH#: 1.5 10*3/uL (ref 0.9–3.3)
LYMPH%: 24.3 % (ref 14.0–49.7)
MCH: 32.2 pg (ref 25.1–34.0)
MCHC: 32.2 g/dL (ref 31.5–36.0)
MCV: 99.8 fL (ref 79.5–101.0)
MONO#: 0.8 10*3/uL (ref 0.1–0.9)
MONO%: 11.9 % (ref 0.0–14.0)
NEUT%: 61.8 % (ref 38.4–76.8)
NEUTROS ABS: 3.9 10*3/uL (ref 1.5–6.5)
Platelets: 204 10*3/uL (ref 145–400)
RBC: 3.83 10*6/uL (ref 3.70–5.45)
RDW: 13.2 % (ref 11.2–14.5)
WBC: 6.4 10*3/uL (ref 3.9–10.3)

## 2013-12-05 ENCOUNTER — Ambulatory Visit: Payer: BC Managed Care – PPO | Admitting: Oncology

## 2013-12-06 ENCOUNTER — Ambulatory Visit (HOSPITAL_BASED_OUTPATIENT_CLINIC_OR_DEPARTMENT_OTHER): Payer: BC Managed Care – PPO

## 2013-12-06 ENCOUNTER — Telehealth: Payer: Self-pay | Admitting: Oncology

## 2013-12-06 ENCOUNTER — Ambulatory Visit (HOSPITAL_BASED_OUTPATIENT_CLINIC_OR_DEPARTMENT_OTHER): Payer: BC Managed Care – PPO | Admitting: Oncology

## 2013-12-06 VITALS — BP 120/67 | HR 84 | Temp 98.0°F | Resp 18 | Ht 63.75 in | Wt 157.1 lb

## 2013-12-06 DIAGNOSIS — E038 Other specified hypothyroidism: Secondary | ICD-10-CM

## 2013-12-06 DIAGNOSIS — Z853 Personal history of malignant neoplasm of breast: Secondary | ICD-10-CM

## 2013-12-06 DIAGNOSIS — E079 Disorder of thyroid, unspecified: Secondary | ICD-10-CM

## 2013-12-06 NOTE — Telephone Encounter (Signed)
per pof to sch pt appt-gave pt copy of sch-did lab add on

## 2013-12-06 NOTE — Progress Notes (Signed)
ID: Julie Cortez   DOB: 1950/05/13  MR#: [id number]  RFX#:[id number]  PCP: Donnie Coffin, MD GYN: SU:  OTHER MD: Ocie Doyne   HISTORY OF PRESENT ILLNESS: From the original intake not:  Julie Cortez had neglected her health maintenance and she had not had a mammogram  before Christmas 2005, when she noted a mass in her right breast. She eventually brought this to Dr. Virgilio Belling attention (she did not have a primary doctor at that time, but Dr. Alroy Dust takes care of her husband, Julie Cortez) and he set her up for mammography on April 04, 2004. She had a palpable mass at the 12 o'clock position in the right breast and by mammogram, this was spiculated without a malignant type microcalcification. The ultrasound showed the mass to be approximately 2.1 cm. Biopsy was performed the same day and showed ([id number]) invasive mammary carcinoma, which was ER/PR and HER-2 negative.    With this information, the patient was referred to Dr. Marylene Buerger who on January 11 proceed to a right lumpectomy with sentinel lymph node biopsy. That report ([id number]) shows a multifocal tumor, the largest mass being 4.2 cm, two other masses being 1.2 and 0.7 mm, grade 3, with  0 of 2 lymph nodes involved. There was extensive lymphovascular invasion and the margins were positive.  Accordingly, Dr. Annamaria Boots went back on January 30th and the final margins were negative, but still close at 1 mm superiorly.  There was also extensive LVI noted again in this sample.   Julie Cortez's subsequent history is as detailed below.  INTERVAL HISTORY: Julie Cortez returns today for followup of her triple negative breast cancer, accompanied by her husband Julie Cortez. The interval history has been unremarkable. She continues to exercise 1-2 hours 5 days a week at the gym. She can't understand how with all that exercise her weight is not going down. She did have an episode last year where she was off her feet for a while because of problems with the foot  (she was on Rivaroxaban for a month, with no complications) and that's when the weight gain occurred. She wonders if her thyroid is appropriately controlled.  REVIEW OF SYSTEMS: A detailed review of systems today was otherwise entirely negative  PAST MEDICAL HISTORY: Past Medical History  Diagnosis Date  . Thyroid disease     goiter  . Cancer 2006    right Breast  Significant for status post Port placement, psoriasis, history of postpartum pulmonary embolus, history of tobacco abuse, history of Clomid use in the late 70s, history of anxiety /depression and history of left breast cyst aspiration.   PAST SURGICAL HISTORY: Past Surgical History  Procedure Laterality Date  . Laproscopic cyst removal  March 2006  . Cystectomy  2006  . Breast lumpectomy  2006    right breast    FAMILY HISTORY Family History  Problem Relation Age of Onset  . Cancer Father     Lung  . Cancer Mother     brain  Her father died from widely disseminated cancer at the age of 58. They really do not know what the primary site was. It was quite widespread at the time of diagnosis and he died within six days of diagnosis. Her mother had superficial bladder cancer and then developed a central nervous system non-Hodgkin's lymphoma.  She died within six months of diagnosis.  The patient has one brother and one sister.  There is no history of breast cancer in the family and no history of ovarian cancer  in the family.  GYNECOLOGIC HISTORY: She is G3, P3. First delivery age 59, last menstrual period 28. She did not have significant problems with hot flashes and never took hormone replacement therapy.   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  Her husband, Julie Cortez, is present today. He owns and runs a Hotel manager. Their three children are Julie Cortez, who lives in [place] and is in Press photographer; Julie Cortez, who lives in  [place] and works for [employer]; and Julie Cortez, their son 21 who is junior in [place] and [place] for the school.  She is a member of [employer].    ADVANCED DIRECTIVES: in place  HEALTH MAINTENANCE: History  Substance Use Topics  . Smoking status: Former Smoker    Quit date: 04/07/2004  . Smokeless tobacco: Not on file  . Alcohol Use: Yes     Colonoscopy:  PAP:  Bone density:  Lipid panel:  No Known Allergies  Current Outpatient Prescriptions  Medication Sig Dispense Refill  . acitretin (SORIATANE) 25 MG capsule Take 25 mg by mouth daily before breakfast.      . CALCIUM-VITAMIN D PO Take 500 mg by mouth 2 (two) times daily.        . folic acid (FOLVITE) 1 MG tablet Take 1 mg by mouth daily.      Marland Kitchen levothyroxine (SYNTHROID, LEVOTHROID) 50 MCG tablet Take 50 mcg by mouth daily.        . methotrexate (RHEUMATREX) 2.5 MG tablet Take 2.5 mg by mouth 3 (three) times a week. Caution:Chemotherapy. Protect from light.       No current facility-administered medications for this visit.    OBJECTIVE: Middle-aged white woman in no acute distress Filed Vitals:   12/06/13 1440  BP: 120/67  Pulse: 84  Temp: 98 F (36.7 C)  Resp: 18     Body mass index is 27.19 kg/(m^2).    ECOG FS: 0 Sclerae unicteric, pupils are round and equal Oropharynx clear No cervical or supraclavicular adenopathy Lungs no rales or rhonchi Heart regular rate and rhythm Abd soft, nontender, positive bowel sounds MSK no focal spinal tenderness, no upper extremity lymphedema Neuro: nonfocal, well-oriented, positive affect Breasts: The right breast is status post lumpectomy and radiation. There is no evidence of local recurrence.. The right axilla is benign. The left breast is unremarkable  LAB RESULTS: Lab Results  Component Value Date   WBC 6.4 11/29/2013   NEUTROABS 3.9 11/29/2013   HGB 12.3 11/29/2013   HCT 38.2 11/29/2013   MCV 99.8 11/29/2013   PLT 204 11/29/2013      Chemistry       Component Value Date/Time   NA 144 11/29/2013 1518   NA 143 09/16/2011 1536   NA 145 08/06/2010 1517   K 3.8 11/29/2013 1518   K 3.4* 09/16/2011 1536   K 4.6 08/06/2010 1517   CL 106 09/21/2012 1503   CL 106 09/16/2011 1536   CL 99 08/06/2010 1517   CO2 29 11/29/2013 1518   CO2 30 09/16/2011 1536   CO2 31 08/06/2010 1517   BUN 12.1 11/29/2013 1518   BUN 13 09/16/2011 1536   BUN 17 08/06/2010 1517   CREATININE 0.7 11/29/2013 1518   CREATININE 0.54 09/16/2011 1536   CREATININE 0.6 08/06/2010 1517      Component Value Date/Time   CALCIUM 8.9 11/29/2013 1518   CALCIUM 9.0 09/16/2011 1536  CALCIUM 9.6 08/06/2010 1517   ALKPHOS 105 11/29/2013 1518   ALKPHOS 96 09/16/2011 1536   AST 20 11/29/2013 1518   AST 20 09/16/2011 1536   ALT 14 11/29/2013 1518   ALT 20 09/16/2011 1536   BILITOT 0.23 11/29/2013 1518   BILITOT 0.2* 09/16/2011 1536       Lab Results  Component Value Date   LABCA2 25 09/16/2011    No components found with this basename: LABCA125    No results found for this basename: INR,  in the last 168 hours  Urinalysis No results found for this basename: colorurine,  appearanceur,  labspec,  phurine,  glucoseu,  hgbur,  bilirubinur,  ketonesur,  proteinur,  urobilinogen,  nitrite,  leukocytesur    STUDIES: CLINICAL DATA: Screening. History of right lumpectomy with  chemotherapy and radiation treatment in 2006.  EXAM:  DIGITAL SCREENING BILATERAL MAMMOGRAM WITH 3D TOMO WITH CAD  COMPARISON: Previous exam(s).  ACR Breast Density Category b: There are scattered areas of  fibroglandular density.  FINDINGS:  There are no findings suspicious for malignancy. Posttreatment  changes are seen in the right breast. Images were processed with  CAD.  IMPRESSION:  No mammographic evidence of malignancy. A result letter of this  screening mammogram will be mailed directly to the patient.  RECOMMENDATION:  Screening mammogram in one year. (Code:SM-B-01Y)  BI-RADS CATEGORY 1: Negative.   Electronically Signed  By: Shon Hale M.D.  On: 05/09/2013 16:18   ASSESSMENT: 63 y.o.  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    PLAN:  [place] is doing terrific from a breast cancer point of view, and will soon be 10 years out from her definitive surgery. This is very favorable. She would like to continue to be followed here on a yearly basis, and we can certainly accommodate that. If and when we develop a functional survivorship clinic, she will be an excellent candidate for it.  She wondered if perhaps her thyroid medication was too low and that was responsible for her weight gain. However her TSH today was 0.579. My recommendation to her is that she avoid starches in her diet, and continue her exercise program. She should aim to lose between one half and 1 pound a month. She can stand that track she can get back to 1 overweight she would like to be at within 2 years.  She knows to call for any problems that may develop before her next visit here.   MAGRINAT,GUSTAV C    12/06/2013

## 2013-12-07 LAB — TSH CHCC: TSH: 0.579 m[IU]/L (ref 0.308–3.960)

## 2013-12-09 ENCOUNTER — Telehealth: Payer: Self-pay | Admitting: *Deleted

## 2013-12-09 NOTE — Telephone Encounter (Signed)
Informed pt of thyroid level.

## 2013-12-09 NOTE — Addendum Note (Signed)
Addended by: Laureen Abrahams on: 12/09/2013 05:57 PM   Modules accepted: Orders, Medications

## 2014-02-23 ENCOUNTER — Other Ambulatory Visit: Payer: Self-pay | Admitting: Endocrinology

## 2014-02-23 DIAGNOSIS — E049 Nontoxic goiter, unspecified: Secondary | ICD-10-CM

## 2014-02-27 ENCOUNTER — Other Ambulatory Visit: Payer: BC Managed Care – PPO

## 2014-02-27 ENCOUNTER — Other Ambulatory Visit: Payer: Self-pay | Admitting: Endocrinology

## 2014-02-27 DIAGNOSIS — Z1231 Encounter for screening mammogram for malignant neoplasm of breast: Secondary | ICD-10-CM

## 2014-02-27 DIAGNOSIS — M8580 Other specified disorders of bone density and structure, unspecified site: Secondary | ICD-10-CM

## 2014-03-06 ENCOUNTER — Ambulatory Visit
Admission: RE | Admit: 2014-03-06 | Discharge: 2014-03-06 | Disposition: A | Discharge status: Home / Self Care | Payer: BC Managed Care – PPO | Source: Ambulatory Visit | Attending: Endocrinology | Admitting: Endocrinology

## 2014-03-06 DIAGNOSIS — E049 Nontoxic goiter, unspecified: Secondary | ICD-10-CM

## 2014-03-23 ENCOUNTER — Other Ambulatory Visit: Payer: Self-pay | Admitting: Dermatology

## 2014-05-10 ENCOUNTER — Ambulatory Visit
Admission: RE | Admit: 2014-05-10 | Discharge: 2014-05-10 | Disposition: A | Discharge status: Home / Self Care | Payer: BLUE CROSS/BLUE SHIELD | Source: Ambulatory Visit | Attending: Endocrinology | Admitting: Endocrinology

## 2014-05-10 ENCOUNTER — Other Ambulatory Visit: Payer: BC Managed Care – PPO

## 2014-05-10 ENCOUNTER — Encounter (INDEPENDENT_AMBULATORY_CARE_PROVIDER_SITE_OTHER): Payer: Self-pay

## 2014-05-10 ENCOUNTER — Ambulatory Visit: Payer: BC Managed Care – PPO

## 2014-05-10 DIAGNOSIS — Z1231 Encounter for screening mammogram for malignant neoplasm of breast: Secondary | ICD-10-CM

## 2014-05-10 DIAGNOSIS — M8580 Other specified disorders of bone density and structure, unspecified site: Secondary | ICD-10-CM

## 2014-07-27 ENCOUNTER — Other Ambulatory Visit: Payer: Self-pay | Admitting: Dermatology

## 2014-12-04 ENCOUNTER — Other Ambulatory Visit: Payer: Self-pay | Admitting: *Deleted

## 2014-12-04 DIAGNOSIS — Z853 Personal history of malignant neoplasm of breast: Secondary | ICD-10-CM

## 2014-12-05 ENCOUNTER — Other Ambulatory Visit (HOSPITAL_BASED_OUTPATIENT_CLINIC_OR_DEPARTMENT_OTHER): Payer: BLUE CROSS/BLUE SHIELD

## 2014-12-05 DIAGNOSIS — Z853 Personal history of malignant neoplasm of breast: Secondary | ICD-10-CM | POA: Diagnosis not present

## 2014-12-05 LAB — CBC WITH DIFFERENTIAL/PLATELET
BASO%: 0.6 % (ref 0.0–2.0)
Basophils Absolute: 0 10*3/uL (ref 0.0–0.1)
EOS%: 2.4 % (ref 0.0–7.0)
Eosinophils Absolute: 0.2 10*3/uL (ref 0.0–0.5)
HEMATOCRIT: 38.5 % (ref 34.8–46.6)
HEMOGLOBIN: 12.6 g/dL (ref 11.6–15.9)
LYMPH#: 1.6 10*3/uL (ref 0.9–3.3)
LYMPH%: 22.2 % (ref 14.0–49.7)
MCH: 31.8 pg (ref 25.1–34.0)
MCHC: 32.8 g/dL (ref 31.5–36.0)
MCV: 96.9 fL (ref 79.5–101.0)
MONO#: 0.8 10*3/uL (ref 0.1–0.9)
MONO%: 11.1 % (ref 0.0–14.0)
NEUT%: 63.7 % (ref 38.4–76.8)
NEUTROS ABS: 4.5 10*3/uL (ref 1.5–6.5)
PLATELETS: 212 10*3/uL (ref 145–400)
RBC: 3.98 10*6/uL (ref 3.70–5.45)
RDW: 13.2 % (ref 11.2–14.5)
WBC: 7.1 10*3/uL (ref 3.9–10.3)

## 2014-12-05 LAB — COMPREHENSIVE METABOLIC PANEL (CC13)
ALT: 18 U/L (ref 0–55)
ANION GAP: 9 meq/L (ref 3–11)
AST: 23 U/L (ref 5–34)
Albumin: 3.5 g/dL (ref 3.5–5.0)
Alkaline Phosphatase: 105 U/L (ref 40–150)
BILIRUBIN TOTAL: 0.3 mg/dL (ref 0.20–1.20)
BUN: 14.3 mg/dL (ref 7.0–26.0)
CALCIUM: 9.1 mg/dL (ref 8.4–10.4)
CO2: 27 mEq/L (ref 22–29)
CREATININE: 0.7 mg/dL (ref 0.6–1.1)
Chloride: 106 mEq/L (ref 98–109)
EGFR: >90 mL/min/{1.73_m2} (ref >90)
Glucose: 99 mg/dl (ref 70–140)
Potassium: 3.9 mEq/L (ref 3.5–5.1)
Sodium: 142 mEq/L (ref 136–145)
TOTAL PROTEIN: 6.9 g/dL (ref 6.4–8.3)

## 2014-12-12 ENCOUNTER — Ambulatory Visit (HOSPITAL_BASED_OUTPATIENT_CLINIC_OR_DEPARTMENT_OTHER): Payer: Medicare Other | Admitting: Oncology

## 2014-12-12 ENCOUNTER — Telehealth: Payer: Self-pay | Admitting: Oncology

## 2014-12-12 VITALS — BP 117/62 | HR 90 | Temp 98.8°F | Resp 18 | Ht 63.75 in | Wt 153.5 lb

## 2014-12-12 DIAGNOSIS — C50911 Malignant neoplasm of unspecified site of right female breast: Secondary | ICD-10-CM

## 2014-12-12 DIAGNOSIS — Z853 Personal history of malignant neoplasm of breast: Secondary | ICD-10-CM | POA: Diagnosis not present

## 2014-12-12 NOTE — Progress Notes (Signed)
ID: Julie Cortez   DOB: [birth year]  MR#: [id number]  NOB#:[id number]  PCP: Donnie Coffin, MD GYN: SU:  OTHER MD: Ocie Doyne   HISTORY OF PRESENT ILLNESS: From the original intake not:  Julie Cortez had neglected her health maintenance and she had not had a mammogram  before Christmas 2005, when she noted a mass in her right breast. She eventually brought this to Dr. Virgilio Belling attention (she did not have a primary doctor at that time, but Dr. Alroy Dust takes care of her husband, Julie Cortez) and he set her up for mammography on April 04, 2004. She had a palpable mass at the 12 o'clock position in the right breast and by mammogram, this was spiculated without a malignant type microcalcification. The ultrasound showed the mass to be approximately 2.1 cm. Biopsy was performed the same day and showed ([id number]) invasive mammary carcinoma, which was ER/PR and HER-2 negative.    With this information, the patient was referred to Dr. Marylene Buerger who on January 11 proceed to a right lumpectomy with sentinel lymph node biopsy. That report ([id number]) shows a multifocal tumor, the largest mass being 4.2 cm, two other masses being 1.2 and 0.7 mm, grade 3, with  0 of 2 lymph nodes involved. There was extensive lymphovascular invasion and the margins were positive.  Accordingly, Dr. Annamaria Boots went back on January 30th and the final margins were negative, but still close at 1 mm superiorly.  There was also extensive LVI noted again in this sample.   Julie Cortez's subsequent history is as detailed below.  INTERVAL HISTORY: Julie Cortez returns today for followup of her triple negative breast cancer, accompanied by her husband Julie Cortez. The interval history is chiefly significant for her having obtained a fit bit. She tries to do between 12 and 16,000 steps a day. She is very competitive where her daughter about this. She also has a stationary bike at home which she sometimes uses.  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 Thyroid disease     goiter  . Cancer 2006    right Breast  Significant for status post Port placement, psoriasis, history of postpartum pulmonary embolus, history of tobacco abuse, history of Clomid use in the late 70s, history of anxiety /depression and history of left breast cyst aspiration.   PAST SURGICAL HISTORY: Past Surgical History  Procedure Laterality Date  . Laproscopic cyst removal  March 2006  . Cystectomy  2006  . Breast lumpectomy  2006    right breast    FAMILY HISTORY Family History  Problem Relation Age of Onset  . Cancer Father     Lung  . Cancer Mother     brain  Her father died from widely disseminated cancer at the age of 40. They really do not know what the primary site was. It was quite widespread at the time of diagnosis and he died within six days of diagnosis. Her mother had superficial bladder cancer and then developed a central nervous system non-Hodgkin's lymphoma.  She died within six months of diagnosis.  The patient has one brother and one sister.  There is no history of breast cancer in the family and no history of ovarian cancer in the family.  GYNECOLOGIC HISTORY: She is G3, P3. First delivery age 41, last menstrual period 77.  She did not have significant problems with hot flashes and never took hormone replacement therapy.   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  Her husband, Julie Cortez, is present today. He owns and runs a Hotel manager. Their three children are [name], who lives in [place] and is in Press photographer; [place], who lives in [place] and works for [employer]; and [place], their son 21 who is junior in [place] and [place]  for the school.  She is a member of [employer].    ADVANCED DIRECTIVES: in place  HEALTH MAINTENANCE: Social History  Substance Use Topics  . Smoking status: Former Smoker    Quit date: 04/07/2004  . Smokeless tobacco: Not on file  . Alcohol Use: Yes     Colonoscopy:  PAP:  Bone density:  Lipid panel:  No Known Allergies  Current Outpatient Prescriptions  Medication Sig Dispense Refill  . CALCIUM-VITAMIN D PO Take 500 mg by mouth 2 (two) times daily.      Marland Kitchen levothyroxine (SYNTHROID, LEVOTHROID) 50 MCG tablet Take 50 mcg by mouth daily.       No current facility-administered medications for this visit.    OBJECTIVE: Middle-aged white woman who appears well Filed Vitals:   12/12/14 1530  BP: 117/62  Pulse: 90  Temp: 98.8 F (37.1 C)  Resp: 18     Body mass index is 26.56 kg/(m^2).    ECOG FS: 0  Sclerae unicteric, EOMs intact Oropharynx clear, dentition in good repair No cervical or supraclavicular adenopathy Lungs no rales or rhonchi Heart regular rate and rhythm Abd soft, nontender, positive bowel sounds MSK no focal spinal tenderness, no upper extremity lymphedema Neuro: nonfocal, well oriented, appropriate affect Breasts: The right breast status post lumpectomy and radiation. There is no evidence of local recurrence. The right axilla is benign per the left breast is unremarkable.   LAB RESULTS: Lab Results  Component Value Date   WBC 7.1 12/05/2014   NEUTROABS 4.5 12/05/2014   HGB 12.6 12/05/2014   HCT 38.5 12/05/2014   MCV 96.9 12/05/2014   PLT 212 12/05/2014      Chemistry      Component Value Date/Time   NA 142 12/05/2014 1502   NA 143 09/16/2011 1536   NA 145 08/06/2010 1517   K 3.9 12/05/2014 1502   K 3.4* 09/16/2011 1536   K 4.6 08/06/2010 1517   CL 106 09/21/2012 1503   CL 106 09/16/2011 1536   CL 99 08/06/2010 1517   CO2 27 12/05/2014 1502   CO2 30 09/16/2011 1536   CO2 31 08/06/2010 1517   BUN 14.3 12/05/2014 1502   BUN 13  09/16/2011 1536   BUN 17 08/06/2010 1517   CREATININE 0.7 12/05/2014 1502   CREATININE 0.54 09/16/2011 1536   CREATININE 0.6 08/06/2010 1517      Component Value Date/Time   CALCIUM 9.1 12/05/2014 1502   CALCIUM 9.0 09/16/2011 1536   CALCIUM 9.6 08/06/2010 1517   ALKPHOS 105 12/05/2014 1502   ALKPHOS 96 09/16/2011 1536   AST 23 12/05/2014 1502   AST 20 09/16/2011 1536   ALT 18 12/05/2014 1502   ALT 20 09/16/2011 1536   BILITOT 0.30 12/05/2014 1502   BILITOT 0.2* 09/16/2011 1536       Lab Results  Component Value Date   LABCA2 25 09/16/2011    No components found for: [id number]  No results for input(s): INR in the last 168 hours.  Urinalysis No results found for: COLORURINE  STUDIES: CLINICAL DATA: Screening. History of right lumpectomy for breast cancer 2006.  EXAM: DIGITAL SCREENING BILATERAL MAMMOGRAM WITH 3D TOMO WITH CAD  COMPARISON: Previous exam(s).  ACR Breast Density Category a: The breast tissue is almost entirely fatty.  FINDINGS: There are no findings suspicious for malignancy. Images were processed with CAD. Right lumpectomy changes are reidentified.  IMPRESSION: No mammographic evidence of malignancy. A result letter of this screening mammogram will be mailed directly to the patient.  RECOMMENDATION: Screening mammogram in one year. (Code:SM-B-01Y)  BI-RADS CATEGORY 2: Benign.   Electronically Signed  By: Conchita Paris M.D.  On: 05/10/2014 14:28  ASSESSMENT: 64 y.o. 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    PLAN:  Alaze continues to do terrific as far as breast cancer is concerned, now 10 years out from her definitive surgery for breast cancer. There is no  evidence of disease recurrence.  She keeps up with developments by volunteering here, and she is also a great resource for our other patients.  She prefers to be seen on a once a year basis "just to make sure" so I have scheduled to her again a year from now, with lab work before the visit. We did discuss the fact that CA-27-29 is not a reliable marker and therefore we should not be obtaining it.   We did discuss exercise issues and she has a wonderful program right now. We discussed briefer more intense fartlek-like exercises. If she keeps that up she will meet all her goals easily.  She knows to call for any problems that may develop before her next visit here.  Elsa Ploch C    12/12/2014

## 2014-12-12 NOTE — Telephone Encounter (Signed)
Appointments made and avs printed for patient °

## 2015-02-26 ENCOUNTER — Other Ambulatory Visit: Payer: BC Managed Care – PPO

## 2015-02-26 ENCOUNTER — Other Ambulatory Visit: Payer: Self-pay | Admitting: Endocrinology

## 2015-02-26 DIAGNOSIS — E049 Nontoxic goiter, unspecified: Secondary | ICD-10-CM | POA: Diagnosis not present

## 2015-02-26 DIAGNOSIS — M899 Disorder of bone, unspecified: Secondary | ICD-10-CM | POA: Diagnosis not present

## 2015-02-26 DIAGNOSIS — E039 Hypothyroidism, unspecified: Secondary | ICD-10-CM | POA: Diagnosis not present

## 2015-03-08 ENCOUNTER — Ambulatory Visit
Admission: RE | Admit: 2015-03-08 | Discharge: 2015-03-08 | Disposition: A | Discharge status: Home / Self Care | Payer: Medicare Other | Source: Ambulatory Visit | Attending: Endocrinology | Admitting: Endocrinology

## 2015-03-08 DIAGNOSIS — E042 Nontoxic multinodular goiter: Secondary | ICD-10-CM | POA: Diagnosis not present

## 2015-03-08 DIAGNOSIS — E049 Nontoxic goiter, unspecified: Secondary | ICD-10-CM

## 2015-03-12 DIAGNOSIS — L400 Psoriasis vulgaris: Secondary | ICD-10-CM | POA: Diagnosis not present

## 2015-03-12 DIAGNOSIS — L570 Actinic keratosis: Secondary | ICD-10-CM | POA: Diagnosis not present

## 2015-03-12 DIAGNOSIS — Z85828 Personal history of other malignant neoplasm of skin: Secondary | ICD-10-CM | POA: Diagnosis not present

## 2015-03-12 DIAGNOSIS — Z79899 Other long term (current) drug therapy: Secondary | ICD-10-CM | POA: Diagnosis not present

## 2015-03-12 DIAGNOSIS — L578 Other skin changes due to chronic exposure to nonionizing radiation: Secondary | ICD-10-CM | POA: Diagnosis not present

## 2015-03-28 DIAGNOSIS — J329 Chronic sinusitis, unspecified: Secondary | ICD-10-CM | POA: Diagnosis not present

## 2015-04-19 DIAGNOSIS — Z85828 Personal history of other malignant neoplasm of skin: Secondary | ICD-10-CM | POA: Diagnosis not present

## 2015-04-19 DIAGNOSIS — L570 Actinic keratosis: Secondary | ICD-10-CM | POA: Diagnosis not present

## 2015-04-19 DIAGNOSIS — L97129 Non-pressure chronic ulcer of left thigh with unspecified severity: Secondary | ICD-10-CM | POA: Diagnosis not present

## 2015-04-19 DIAGNOSIS — D485 Neoplasm of uncertain behavior of skin: Secondary | ICD-10-CM | POA: Diagnosis not present

## 2015-04-19 DIAGNOSIS — C44722 Squamous cell carcinoma of skin of right lower limb, including hip: Secondary | ICD-10-CM | POA: Diagnosis not present

## 2015-05-01 ENCOUNTER — Other Ambulatory Visit: Payer: Self-pay

## 2015-05-01 DIAGNOSIS — Z1231 Encounter for screening mammogram for malignant neoplasm of breast: Secondary | ICD-10-CM

## 2015-05-23 ENCOUNTER — Ambulatory Visit
Admission: RE | Admit: 2015-05-23 | Discharge: 2015-05-23 | Disposition: A | Discharge status: Home / Self Care | Payer: Medicare Other | Source: Ambulatory Visit

## 2015-05-23 DIAGNOSIS — Z1231 Encounter for screening mammogram for malignant neoplasm of breast: Secondary | ICD-10-CM

## 2015-06-14 DIAGNOSIS — L570 Actinic keratosis: Secondary | ICD-10-CM | POA: Diagnosis not present

## 2015-06-14 DIAGNOSIS — Z79899 Other long term (current) drug therapy: Secondary | ICD-10-CM | POA: Diagnosis not present

## 2015-06-14 DIAGNOSIS — L400 Psoriasis vulgaris: Secondary | ICD-10-CM | POA: Diagnosis not present

## 2015-06-14 DIAGNOSIS — Z85828 Personal history of other malignant neoplasm of skin: Secondary | ICD-10-CM | POA: Diagnosis not present

## 2015-07-13 DIAGNOSIS — E782 Mixed hyperlipidemia: Secondary | ICD-10-CM | POA: Diagnosis not present

## 2015-07-13 DIAGNOSIS — Z0000 Encounter for general adult medical examination without abnormal findings: Secondary | ICD-10-CM | POA: Diagnosis not present

## 2015-07-13 DIAGNOSIS — Z23 Encounter for immunization: Secondary | ICD-10-CM | POA: Diagnosis not present

## 2015-07-25 ENCOUNTER — Encounter: Payer: Self-pay | Admitting: Gastroenterology

## 2015-08-06 DIAGNOSIS — R070 Pain in throat: Secondary | ICD-10-CM | POA: Diagnosis not present

## 2015-08-06 DIAGNOSIS — J020 Streptococcal pharyngitis: Secondary | ICD-10-CM | POA: Diagnosis not present

## 2015-09-05 DIAGNOSIS — H0289 Other specified disorders of eyelid: Secondary | ICD-10-CM | POA: Diagnosis not present

## 2015-09-05 DIAGNOSIS — D3131 Benign neoplasm of right choroid: Secondary | ICD-10-CM | POA: Diagnosis not present

## 2015-09-05 DIAGNOSIS — H2513 Age-related nuclear cataract, bilateral: Secondary | ICD-10-CM | POA: Diagnosis not present

## 2015-09-05 DIAGNOSIS — H40013 Open angle with borderline findings, low risk, bilateral: Secondary | ICD-10-CM | POA: Diagnosis not present

## 2015-09-05 DIAGNOSIS — H11001 Unspecified pterygium of right eye: Secondary | ICD-10-CM | POA: Diagnosis not present

## 2015-09-05 DIAGNOSIS — D3132 Benign neoplasm of left choroid: Secondary | ICD-10-CM | POA: Diagnosis not present

## 2015-09-05 DIAGNOSIS — H10413 Chronic giant papillary conjunctivitis, bilateral: Secondary | ICD-10-CM | POA: Diagnosis not present

## 2015-09-14 DIAGNOSIS — Z85828 Personal history of other malignant neoplasm of skin: Secondary | ICD-10-CM | POA: Diagnosis not present

## 2015-09-14 DIAGNOSIS — D1801 Hemangioma of skin and subcutaneous tissue: Secondary | ICD-10-CM | POA: Diagnosis not present

## 2015-09-14 DIAGNOSIS — L812 Freckles: Secondary | ICD-10-CM | POA: Diagnosis not present

## 2015-09-14 DIAGNOSIS — L400 Psoriasis vulgaris: Secondary | ICD-10-CM | POA: Diagnosis not present

## 2015-09-14 DIAGNOSIS — L578 Other skin changes due to chronic exposure to nonionizing radiation: Secondary | ICD-10-CM | POA: Diagnosis not present

## 2015-09-14 DIAGNOSIS — Z79899 Other long term (current) drug therapy: Secondary | ICD-10-CM | POA: Diagnosis not present

## 2015-09-14 DIAGNOSIS — D225 Melanocytic nevi of trunk: Secondary | ICD-10-CM | POA: Diagnosis not present

## 2015-09-14 DIAGNOSIS — D485 Neoplasm of uncertain behavior of skin: Secondary | ICD-10-CM | POA: Diagnosis not present

## 2015-12-13 DIAGNOSIS — D692 Other nonthrombocytopenic purpura: Secondary | ICD-10-CM | POA: Diagnosis not present

## 2015-12-13 DIAGNOSIS — L812 Freckles: Secondary | ICD-10-CM | POA: Diagnosis not present

## 2015-12-13 DIAGNOSIS — Z85828 Personal history of other malignant neoplasm of skin: Secondary | ICD-10-CM | POA: Diagnosis not present

## 2015-12-13 DIAGNOSIS — Z79899 Other long term (current) drug therapy: Secondary | ICD-10-CM | POA: Diagnosis not present

## 2015-12-13 DIAGNOSIS — L400 Psoriasis vulgaris: Secondary | ICD-10-CM | POA: Diagnosis not present

## 2015-12-17 ENCOUNTER — Other Ambulatory Visit: Payer: Self-pay | Admitting: *Deleted

## 2015-12-17 DIAGNOSIS — C50911 Malignant neoplasm of unspecified site of right female breast: Secondary | ICD-10-CM

## 2015-12-18 ENCOUNTER — Other Ambulatory Visit: Payer: BLUE CROSS/BLUE SHIELD

## 2015-12-25 ENCOUNTER — Ambulatory Visit (HOSPITAL_BASED_OUTPATIENT_CLINIC_OR_DEPARTMENT_OTHER): Payer: Medicare Other | Admitting: Oncology

## 2015-12-25 ENCOUNTER — Ambulatory Visit (HOSPITAL_BASED_OUTPATIENT_CLINIC_OR_DEPARTMENT_OTHER): Payer: Medicare Other

## 2015-12-25 VITALS — BP 118/71 | HR 102 | Temp 98.4°F | Resp 17 | Ht 63.75 in | Wt 156.7 lb

## 2015-12-25 DIAGNOSIS — Z853 Personal history of malignant neoplasm of breast: Secondary | ICD-10-CM | POA: Diagnosis not present

## 2015-12-25 DIAGNOSIS — C50911 Malignant neoplasm of unspecified site of right female breast: Secondary | ICD-10-CM

## 2015-12-25 DIAGNOSIS — L409 Psoriasis, unspecified: Secondary | ICD-10-CM

## 2015-12-25 LAB — COMPREHENSIVE METABOLIC PANEL
ALBUMIN: 3.4 g/dL — AB (ref 3.5–5.0)
ALK PHOS: 122 U/L (ref 40–150)
ALT: 16 U/L (ref 0–55)
AST: 18 U/L (ref 5–34)
Anion Gap: 9 mEq/L (ref 3–11)
BILIRUBIN TOTAL: 0.3 mg/dL (ref 0.20–1.20)
BUN: 14.1 mg/dL (ref 7.0–26.0)
CO2: 31 meq/L — AB (ref 22–29)
Calcium: 9.5 mg/dL (ref 8.4–10.4)
Chloride: 106 mEq/L (ref 98–109)
Creatinine: 0.8 mg/dL (ref 0.6–1.1)
EGFR: 80 mL/min/{1.73_m2} — ABNORMAL LOW (ref >90)
GLUCOSE: 101 mg/dL (ref 70–140)
Potassium: 3.9 mEq/L (ref 3.5–5.1)
Sodium: 146 mEq/L — ABNORMAL HIGH (ref 136–145)
TOTAL PROTEIN: 7.5 g/dL (ref 6.4–8.3)

## 2015-12-25 LAB — CBC & DIFF AND RETIC
BASO%: 0.2 % (ref 0.0–2.0)
BASOS ABS: 0 10*3/uL (ref 0.0–0.1)
EOS ABS: 0.1 10*3/uL (ref 0.0–0.5)
EOS%: 1.2 % (ref 0.0–7.0)
HEMATOCRIT: 39.8 % (ref 34.8–46.6)
HEMOGLOBIN: 13.1 g/dL (ref 11.6–15.9)
IMMATURE RETIC FRACT: 3 % (ref 1.60–10.00)
LYMPH%: 17.8 % (ref 14.0–49.7)
MCH: 32.1 pg (ref 25.1–34.0)
MCHC: 32.9 g/dL (ref 31.5–36.0)
MCV: 97.5 fL (ref 79.5–101.0)
MONO#: 0.8 10*3/uL (ref 0.1–0.9)
MONO%: 8.9 % (ref 0.0–14.0)
NEUT#: 6.5 10*3/uL (ref 1.5–6.5)
NEUT%: 71.9 % (ref 38.4–76.8)
Platelets: 248 10*3/uL (ref 145–400)
RBC: 4.08 10*6/uL (ref 3.70–5.45)
RDW: 13.3 % (ref 11.2–14.5)
RETIC %: 1.59 % (ref 0.70–2.10)
Retic Ct Abs: 64.87 10*3/uL (ref 33.70–90.70)
WBC: 9.1 10*3/uL (ref 3.9–10.3)
lymph#: 1.6 10*3/uL (ref 0.9–3.3)

## 2015-12-25 NOTE — Progress Notes (Signed)
ID: Roselie Awkward   DOB: Oct 16, 1950  MR#: [id number]  MEQ#:[id number]  PCP: Donnie Coffin, MD GYN: SU:  OTHER MD: Ocie Doyne   HISTORY OF PRESENT ILLNESS: From the original intake not:  [name] had neglected her health maintenance and she had not had a mammogram  before Christmas 2005, when she noted a mass in her right breast. She eventually brought this to Dr. Virgilio Belling attention (she did not have a primary doctor at that time, but Dr. Alroy Dust takes care of her husband, Jeani Hawking) and he set her up for mammography on April 04, 2004. She had a palpable mass at the 12 o'clock position in the right breast and by mammogram, this was spiculated without a malignant type microcalcification. The ultrasound showed the mass to be approximately 2.1 cm. Biopsy was performed the same day and showed ([id number]) invasive mammary carcinoma, which was ER/PR and HER-2 negative.    With this information, the patient was referred to Dr. Marylene Buerger who on January 11 proceed to a right lumpectomy with sentinel lymph node biopsy. That report ([id number]) shows a multifocal tumor, the largest mass being 4.2 cm, two other masses being 1.2 and 0.7 mm, grade 3, with  0 of 2 lymph nodes involved. There was extensive lymphovascular invasion and the margins were positive.  Accordingly, Dr. Annamaria Boots went back on January 30th and the final margins were negative, but still close at 1 mm superiorly.  There was also extensive LVI noted again in this sample.   Daesia's subsequent history is as detailed below.  INTERVAL HISTORY: Honesti returns today for followup of her breast cancer, which was triple negative. Interval history is generally unremarkable. She is concern about weight gain, but works with a trainer 3 days a week and walks 14,000 steps a day.   REVIEW OF SYSTEMS: Aside from the weight issue, she tells me her psoriasis is well-controlled on her current medications. She has some thyroid concerns, which are  handled by Dr. Alroy Dust. A detailed review of systems was otherwise stable  PAST MEDICAL HISTORY: Past Medical History:  Diagnosis Date  . Breast cancer ([place]) 2006   right Breast  . Thyroid disease    goiter  Significant for status post Port placement, psoriasis, history of postpartum pulmonary embolus, history of tobacco abuse, history of Clomid use in the late 70s, history of anxiety /depression and history of left breast cyst aspiration.   PAST SURGICAL HISTORY: Past Surgical History:  Procedure Laterality Date  . BREAST LUMPECTOMY  2006   right breast  . CYSTECTOMY  2006  . laproscopic cyst removal  March 2006    FAMILY HISTORY Family History  Problem Relation Age of Onset  . Lung cancer Father     Lung  . Brain cancer Mother     brain  Her father died from widely disseminated cancer at the age of 10. They really do not know what the primary site was. It was quite widespread at the time of diagnosis and he died within six days of diagnosis. Her mother had superficial bladder cancer and then developed a central nervous system non-Hodgkin's lymphoma.  She died within six months of diagnosis.  The patient has one brother and one sister.  There is no history of breast cancer in the family and no history of ovarian cancer in the family.  GYNECOLOGIC HISTORY: She is G3, P3. First delivery age 54, last menstrual period 21. She did not have significant problems with hot flashes and never  took hormone replacement therapy.   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  Her husband, Jeani Hawking, is present today. He owns and runs a Hotel manager. Their three children are [name], who lives in [place] and is in Press photographer; [place], who lives in [place] and works for [employer]; and [place], their son 21 who is junior in [place] and [place] for the school.  She is a member of  [employer].    ADVANCED DIRECTIVES: in place  HEALTH MAINTENANCE: Social History  Substance Use Topics  . Smoking status: Former Smoker    Quit date: 04/07/2004  . Smokeless tobacco: Not on file  . Alcohol use Yes     Colonoscopy:  PAP:  Bone density:  Lipid panel:  No Known Allergies  Current Outpatient Prescriptions  Medication Sig Dispense Refill  . ACITRETIN PO Take by mouth.    Marland Kitchen CALCIUM-VITAMIN D PO Take 500 mg by mouth 2 (two) times daily.      Marland Kitchen levothyroxine (SYNTHROID, LEVOTHROID) 50 MCG tablet Take 50 mcg by mouth daily.       No current facility-administered medications for this visit.     OBJECTIVE: Middle-aged white woman In no acute distress Vitals:   12/25/15 1507  BP: 118/71  Pulse: (!) 102  Resp: 17  Temp: 98.4 F (36.9 C)     Body mass index is 27.11 kg/m.    ECOG FS: 0  Sclerae unicteric, pupils round and equal Oropharynx clear and moist-- no thrush or other lesions No cervical or supraclavicular adenopathy Lungs no rales or rhonchi Heart regular rate and rhythm Abd soft, nontender, positive bowel sounds MSK no focal spinal tenderness, no upper extremity lymphedema Neuro: nonfocal, well oriented, appropriate affect Breasts:the right breast is status post lumpectomy followed by radiation. There is no evidence of local recurrence. The right axilla is benign. The left breast is unremarkable   LAB RESULTS: Lab Results  Component Value Date   WBC 9.1 12/25/2015   NEUTROABS 6.5 12/25/2015   HGB 13.1 12/25/2015   HCT 39.8 12/25/2015   MCV 97.5 12/25/2015   PLT 248 12/25/2015      Chemistry      Component Value Date/Time   NA 146 (H) 12/25/2015 1557   K 3.9 12/25/2015 1557   CL 106 09/21/2012 1503   CO2 31 (H) 12/25/2015 1557   BUN 14.1 12/25/2015 1557   CREATININE 0.8 12/25/2015 1557      Component Value Date/Time   CALCIUM 9.5 12/25/2015 1557   ALKPHOS 122 12/25/2015 1557   AST 18 12/25/2015 1557   ALT 16 12/25/2015 1557    BILITOT 0.30 12/25/2015 1557       Lab Results  Component Value Date   LABCA2 25 09/16/2011    No components found for: [id number]  No results for input(s): INR in the last 168 hours.  Urinalysis No results found for: COLORURINE  STUDIES: CLINICAL DATA:  Screening. History of treated right breast cancer, status post lumpectomy, chemo and radiation therapy in 2006.  EXAM: DIGITAL SCREENING BILATERAL MAMMOGRAM WITH 3D TOMO WITH CAD  COMPARISON:  Previous exam(s).  ACR Breast Density Category b: There are scattered areas of fibroglandular density.  FINDINGS: There are no findings suspicious for malignancy. Images were processed with CAD.  IMPRESSION: No mammographic evidence of malignancy. A result letter of this screening mammogram will be mailed directly to the  patient.  RECOMMENDATION: Screening mammogram in one year. (Code:SM-B-01Y)  BI-RADS CATEGORY  1: Negative.   Electronically Signed   By: Fidela Salisbury M.D.   On: 05/24/2015 12:21   ASSESSMENT: 65 y.o. 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    PLAN:  Danny will soon be 12 years out from definitive surgery for her breast cancer. There is no evidence of disease recurrence. This is very favorable.  Despite this good news she tells me her mother-in-law's cancer recurred after 11 years and took her life in a short time after that.  We discussed the fact that at this point a recurrence rate of about 1-2% per yearmay be expected. This means she has a significant chance of not having a recurrence in the coming year. The fact that she is exercising and generally he is a good diet is helpful also in that regard.  She did not have lab  work before this visit so we obtain that today. She will see me again in a year with lab work a few days before.  We also discussed staging studies which are not recommended in the absence of specific symptoms to evaluate.  Otherwise she knows to call for any problems that may develop before her next visit here. Abril Cappiello C    12/25/2015

## 2016-03-13 DIAGNOSIS — L400 Psoriasis vulgaris: Secondary | ICD-10-CM | POA: Diagnosis not present

## 2016-03-13 DIAGNOSIS — D1801 Hemangioma of skin and subcutaneous tissue: Secondary | ICD-10-CM | POA: Diagnosis not present

## 2016-03-13 DIAGNOSIS — L570 Actinic keratosis: Secondary | ICD-10-CM | POA: Diagnosis not present

## 2016-03-13 DIAGNOSIS — Z85828 Personal history of other malignant neoplasm of skin: Secondary | ICD-10-CM | POA: Diagnosis not present

## 2016-05-05 ENCOUNTER — Other Ambulatory Visit: Payer: Self-pay | Admitting: Endocrinology

## 2016-05-05 DIAGNOSIS — Z1231 Encounter for screening mammogram for malignant neoplasm of breast: Secondary | ICD-10-CM

## 2016-05-07 ENCOUNTER — Other Ambulatory Visit: Payer: Self-pay | Admitting: Endocrinology

## 2016-05-07 DIAGNOSIS — M8580 Other specified disorders of bone density and structure, unspecified site: Secondary | ICD-10-CM

## 2016-06-11 ENCOUNTER — Ambulatory Visit
Admission: RE | Admit: 2016-06-11 | Discharge: 2016-06-11 | Disposition: A | Discharge status: Home / Self Care | Payer: Medicare Other | Source: Ambulatory Visit | Attending: Endocrinology | Admitting: Endocrinology

## 2016-06-11 DIAGNOSIS — Z1231 Encounter for screening mammogram for malignant neoplasm of breast: Secondary | ICD-10-CM

## 2016-06-11 DIAGNOSIS — M8580 Other specified disorders of bone density and structure, unspecified site: Secondary | ICD-10-CM

## 2016-06-11 DIAGNOSIS — M85852 Other specified disorders of bone density and structure, left thigh: Secondary | ICD-10-CM | POA: Diagnosis not present

## 2016-06-11 DIAGNOSIS — Z780 Asymptomatic menopausal state: Secondary | ICD-10-CM | POA: Diagnosis not present

## 2016-06-17 DIAGNOSIS — E049 Nontoxic goiter, unspecified: Secondary | ICD-10-CM | POA: Diagnosis not present

## 2016-06-19 DIAGNOSIS — L570 Actinic keratosis: Secondary | ICD-10-CM | POA: Diagnosis not present

## 2016-06-19 DIAGNOSIS — Z85828 Personal history of other malignant neoplasm of skin: Secondary | ICD-10-CM | POA: Diagnosis not present

## 2016-06-19 DIAGNOSIS — Z79899 Other long term (current) drug therapy: Secondary | ICD-10-CM | POA: Diagnosis not present

## 2016-06-19 DIAGNOSIS — D225 Melanocytic nevi of trunk: Secondary | ICD-10-CM | POA: Diagnosis not present

## 2016-06-19 DIAGNOSIS — L400 Psoriasis vulgaris: Secondary | ICD-10-CM | POA: Diagnosis not present

## 2016-06-19 DIAGNOSIS — L821 Other seborrheic keratosis: Secondary | ICD-10-CM | POA: Diagnosis not present

## 2016-06-19 DIAGNOSIS — L304 Erythema intertrigo: Secondary | ICD-10-CM | POA: Diagnosis not present

## 2016-06-25 DIAGNOSIS — Z808 Family history of malignant neoplasm of other organs or systems: Secondary | ICD-10-CM | POA: Diagnosis not present

## 2016-06-25 DIAGNOSIS — M899 Disorder of bone, unspecified: Secondary | ICD-10-CM | POA: Diagnosis not present

## 2016-06-25 DIAGNOSIS — E049 Nontoxic goiter, unspecified: Secondary | ICD-10-CM | POA: Diagnosis not present

## 2016-06-26 ENCOUNTER — Other Ambulatory Visit: Payer: Self-pay | Admitting: Endocrinology

## 2016-06-26 DIAGNOSIS — E049 Nontoxic goiter, unspecified: Secondary | ICD-10-CM

## 2016-08-06 DIAGNOSIS — Z0000 Encounter for general adult medical examination without abnormal findings: Secondary | ICD-10-CM | POA: Diagnosis not present

## 2016-08-06 DIAGNOSIS — Z1159 Encounter for screening for other viral diseases: Secondary | ICD-10-CM | POA: Diagnosis not present

## 2016-08-06 DIAGNOSIS — Z23 Encounter for immunization: Secondary | ICD-10-CM | POA: Diagnosis not present

## 2016-08-06 DIAGNOSIS — E782 Mixed hyperlipidemia: Secondary | ICD-10-CM | POA: Diagnosis not present

## 2016-09-10 DIAGNOSIS — D3132 Benign neoplasm of left choroid: Secondary | ICD-10-CM | POA: Diagnosis not present

## 2016-09-10 DIAGNOSIS — D3131 Benign neoplasm of right choroid: Secondary | ICD-10-CM | POA: Diagnosis not present

## 2016-09-10 DIAGNOSIS — H0289 Other specified disorders of eyelid: Secondary | ICD-10-CM | POA: Diagnosis not present

## 2016-09-10 DIAGNOSIS — H40013 Open angle with borderline findings, low risk, bilateral: Secondary | ICD-10-CM | POA: Diagnosis not present

## 2016-09-10 DIAGNOSIS — H2513 Age-related nuclear cataract, bilateral: Secondary | ICD-10-CM | POA: Diagnosis not present

## 2016-09-25 DIAGNOSIS — Z85828 Personal history of other malignant neoplasm of skin: Secondary | ICD-10-CM | POA: Diagnosis not present

## 2016-09-25 DIAGNOSIS — Z79899 Other long term (current) drug therapy: Secondary | ICD-10-CM | POA: Diagnosis not present

## 2016-09-25 DIAGNOSIS — L400 Psoriasis vulgaris: Secondary | ICD-10-CM | POA: Diagnosis not present

## 2016-09-25 DIAGNOSIS — L570 Actinic keratosis: Secondary | ICD-10-CM | POA: Diagnosis not present

## 2016-09-25 DIAGNOSIS — L812 Freckles: Secondary | ICD-10-CM | POA: Diagnosis not present

## 2016-11-07 DIAGNOSIS — L400 Psoriasis vulgaris: Secondary | ICD-10-CM | POA: Diagnosis not present

## 2016-11-07 DIAGNOSIS — B078 Other viral warts: Secondary | ICD-10-CM | POA: Diagnosis not present

## 2016-11-07 DIAGNOSIS — L570 Actinic keratosis: Secondary | ICD-10-CM | POA: Diagnosis not present

## 2016-11-07 DIAGNOSIS — Z85828 Personal history of other malignant neoplasm of skin: Secondary | ICD-10-CM | POA: Diagnosis not present

## 2016-12-15 ENCOUNTER — Other Ambulatory Visit: Payer: Self-pay

## 2016-12-15 DIAGNOSIS — C50911 Malignant neoplasm of unspecified site of right female breast: Secondary | ICD-10-CM

## 2016-12-16 ENCOUNTER — Other Ambulatory Visit: Payer: Medicare Other

## 2016-12-18 DIAGNOSIS — L400 Psoriasis vulgaris: Secondary | ICD-10-CM | POA: Diagnosis not present

## 2016-12-18 DIAGNOSIS — Z85828 Personal history of other malignant neoplasm of skin: Secondary | ICD-10-CM | POA: Diagnosis not present

## 2016-12-19 ENCOUNTER — Other Ambulatory Visit (HOSPITAL_BASED_OUTPATIENT_CLINIC_OR_DEPARTMENT_OTHER): Payer: Medicare Other

## 2016-12-19 DIAGNOSIS — C50911 Malignant neoplasm of unspecified site of right female breast: Secondary | ICD-10-CM | POA: Diagnosis not present

## 2016-12-19 LAB — CBC WITH DIFFERENTIAL/PLATELET
BASO%: 0.8 % (ref 0.0–2.0)
Basophils Absolute: 0.1 10*3/uL (ref 0.0–0.1)
EOS ABS: 0.2 10*3/uL (ref 0.0–0.5)
EOS%: 2.4 % (ref 0.0–7.0)
HCT: 42.2 % (ref 34.8–46.6)
HGB: 14 g/dL (ref 11.6–15.9)
LYMPH%: 23.2 % (ref 14.0–49.7)
MCH: 31.9 pg (ref 25.1–34.0)
MCHC: 33.2 g/dL (ref 31.5–36.0)
MCV: 96 fL (ref 79.5–101.0)
MONO#: 0.6 10*3/uL (ref 0.1–0.9)
MONO%: 10.1 % (ref 0.0–14.0)
NEUT%: 63.5 % (ref 38.4–76.8)
NEUTROS ABS: 4.1 10*3/uL (ref 1.5–6.5)
PLATELETS: 230 10*3/uL (ref 145–400)
RBC: 4.4 10*6/uL (ref 3.70–5.45)
RDW: 13.3 % (ref 11.2–14.5)
WBC: 6.4 10*3/uL (ref 3.9–10.3)
lymph#: 1.5 10*3/uL (ref 0.9–3.3)

## 2016-12-19 LAB — COMPREHENSIVE METABOLIC PANEL
ALT: 10 U/L (ref 0–55)
AST: 16 U/L (ref 5–34)
Albumin: 3.5 g/dL (ref 3.5–5.0)
Alkaline Phosphatase: 97 U/L (ref 40–150)
Anion Gap: 8 mEq/L (ref 3–11)
BUN: 16.6 mg/dL (ref 7.0–26.0)
CO2: 28 mEq/L (ref 22–29)
Calcium: 9.5 mg/dL (ref 8.4–10.4)
Chloride: 108 mEq/L (ref 98–109)
Creatinine: 0.7 mg/dL (ref 0.6–1.1)
EGFR: 84 mL/min/{1.73_m2} — ABNORMAL LOW (ref >90)
Glucose: 90 mg/dl (ref 70–140)
Potassium: 4.2 mEq/L (ref 3.5–5.1)
Sodium: 144 mEq/L (ref 136–145)
Total Bilirubin: 0.32 mg/dL (ref 0.20–1.20)
Total Protein: 7.4 g/dL (ref 6.4–8.3)

## 2016-12-23 ENCOUNTER — Ambulatory Visit (HOSPITAL_BASED_OUTPATIENT_CLINIC_OR_DEPARTMENT_OTHER): Payer: Medicare Other | Admitting: Oncology

## 2016-12-23 ENCOUNTER — Telehealth: Payer: Self-pay | Admitting: Oncology

## 2016-12-23 DIAGNOSIS — C50811 Malignant neoplasm of overlapping sites of right female breast: Secondary | ICD-10-CM | POA: Insufficient documentation

## 2016-12-23 DIAGNOSIS — Z853 Personal history of malignant neoplasm of breast: Secondary | ICD-10-CM | POA: Diagnosis not present

## 2016-12-23 DIAGNOSIS — Z171 Estrogen receptor negative status [ER-]: Principal | ICD-10-CM

## 2016-12-23 NOTE — Telephone Encounter (Signed)
Gave patient avs and calendar with appts per 9/18 los.

## 2016-12-23 NOTE — Progress Notes (Signed)
ID: Julie Cortez   DOB: 1951-01-20  MR#: [id number]  MCN#:[id number]  PCP: Aurea Graff.Marlou Sa, MD GYN: SU:  OTHER MD: Ocie Doyne   HISTORY OF PRESENT ILLNESS: From the original intake not:  Julie Cortez had neglected her health maintenance and she had not had a mammogram  before Christmas 2005, when she noted a mass in her right breast. She eventually brought this to Dr. Virgilio Belling attention (she did not have a primary doctor at that time, but Dr. Alroy Dust takes care of her husband, Jeani Hawking) and he set her up for mammography on April 04, 2004. She had a palpable mass at the 12 o'clock position in the right breast and by mammogram, this was spiculated without a malignant type microcalcification. The ultrasound showed the mass to be approximately 2.1 cm. Biopsy was performed the same day and showed ([id number]) invasive mammary carcinoma, which was ER/PR and HER-2 negative.    With this information, the patient was referred to Dr. Marylene Buerger who on January 11 proceed to a right lumpectomy with sentinel lymph node biopsy. That report ([id number]) shows a multifocal tumor, the largest mass being 4.2 cm, two other masses being 1.2 and 0.7 mm, grade 3, with  0 of 2 lymph nodes involved. There was extensive lymphovascular invasion and the margins were positive.  Accordingly, Dr. Annamaria Boots went back on January 30th and the final margins were negative, but still close at 1 mm superiorly.  There was also extensive LVI noted again in this sample.   [name]'s subsequent history is as detailed below.  INTERVAL HISTORY: [name] returns today for followup of her breast cancer, which was triple negative. She notes that her husband is doing well and is still currently working. She also states that her husband recently underwent rotator cuff surgery. Pt reports that she is expecting her second grandchild in 86 weeks and all of her children are in their 91's.    REVIEW OF SYSTEMS: Julie Cortez reports that she  exercises with a trainer 3 times a week. Pt also reports that she ambulates 12,000-15,000 steps daily. She denies unusual headaches, visual changes, nausea, vomiting, or dizziness. There has been no unusual cough, phlegm production, or pleurisy. This been no change in bowel or bladder habits. She denies unexplained fatigue or unexplained weight loss, bleeding, rash, or fever. She continues to have significant problems with psoriasis, but this is no worse than before. A detailed review of systems was otherwise negative.    PAST MEDICAL HISTORY: Past Medical History:  Diagnosis Date  . Breast cancer ([place]) 2006   right Breast  . Thyroid disease    goiter  Significant for status post Port placement, psoriasis, history of postpartum pulmonary embolus, history of tobacco abuse, history of Clomid use in the late 70s, history of anxiety /depression and history of left breast cyst aspiration.   PAST SURGICAL HISTORY: Past Surgical History:  Procedure Laterality Date  . BREAST LUMPECTOMY  2006   right breast  . CYSTECTOMY  2006  . laproscopic cyst removal  March 2006    FAMILY HISTORY Family History  Problem Relation Age of Onset  . Lung cancer Father        Lung  . Brain cancer Mother        brain  Her father died from widely disseminated cancer at the age of 36. They really do not know what the primary site was. It was quite widespread at the time of diagnosis and he died within six days of diagnosis.  Her mother had superficial bladder cancer and then developed a central nervous system non-Hodgkin's lymphoma.  She died within six months of diagnosis.  The patient has one brother and one sister.  There is no history of breast cancer in the family and no history of ovarian cancer in the family.  GYNECOLOGIC HISTORY: She is G3, P3. First delivery age 73, last menstrual period 49. She did not have significant problems with hot flashes and never took hormone replacement therapy.   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  Her husband, Jeani Hawking, is present today. He owns and runs a Hotel manager. Their three children are [name], who lives in [place] and is in Press photographer; [place], who lives in [place] and works for [employer]; and [name], their son 21 who is junior in [place] for the school.  She is a member of [employer].    ADVANCED DIRECTIVES: in place  HEALTH MAINTENANCE: Social History  Substance Use Topics  . Smoking status: Former Smoker    Quit date: 04/07/2004  . Smokeless tobacco: Not on file  . Alcohol use Yes     Colonoscopy:  PAP:  Bone density:  Lipid panel:  No Known Allergies  Current Outpatient Prescriptions  Medication Sig Dispense Refill  . ACITRETIN PO Take by mouth.    Marland Kitchen CALCIUM-VITAMIN D PO Take 500 mg by mouth 2 (two) times daily.      Marland Kitchen levothyroxine (SYNTHROID, LEVOTHROID) 50 MCG tablet Take 50 mcg by mouth daily.       No current facility-administered medications for this visit.     OBJECTIVE: Middle-aged white womanWho appears well  Vitals:   12/23/16 1527  BP: 127/64  Pulse: 75  Resp: 20  Temp: (!) 97.5 F (36.4 C)  SpO2: 100%     Body mass index is 25.76 kg/m.    ECOG FS: 0  Sclerae unicteric, EOMs intact Oropharynx clear and moist No cervical or supraclavicular adenopathy Lungs no rales or rhonchi Heart regular rate and rhythm Abd soft, nontender, positive bowel sounds MSK no focal spinal tenderness, no upper extremity lymphedema Neuro: nonfocal, well oriented, appropriate affect Breasts: The right breast is status post lumpectomy and radiation with no evidence of local recurrence. The cosmetic result is good. The left breast is benign. Both axillae are benign. Skin: Some changes from eczema particularly involving the third and fourth digit nails of the left hand about also the dorsum of  both hands and the area under the left breast.   LAB RESULTS: Lab Results  Component Value Date   WBC 6.4 12/19/2016   NEUTROABS 4.1 12/19/2016   HGB 14.0 12/19/2016   HCT 42.2 12/19/2016   MCV 96.0 12/19/2016   PLT 230 12/19/2016      Chemistry      Component Value Date/Time   NA 144 12/19/2016 1045   K 4.2 12/19/2016 1045   CL 106 09/21/2012 1503   CO2 28 12/19/2016 1045   BUN 16.6 12/19/2016 1045   CREATININE 0.7 12/19/2016 1045      Component Value Date/Time   CALCIUM 9.5 12/19/2016 1045   ALKPHOS 97 12/19/2016 1045   AST 16 12/19/2016 1045   ALT 10 12/19/2016 1045   BILITOT 0.32 12/19/2016 1045       Lab Results  Component Value Date   LABCA2 25 09/16/2011    No components  found for: [id number]  No results for input(s): INR in the last 168 hours.  Urinalysis No results found for: COLORURINE  STUDIES: Bilateral screening mammography with tomography at the [facility] 06/11/2016 shows a breast density to be category B. There was no evidence of malignancy.  ASSESSMENT: 66 y.o. 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    PLAN:  Tejal Is now 12 years out from definitive surgery for her breast cancer with no evidence of disease recurrence. This is very favorable.  She has discovered that with 10,000 steps she maintains her weight and with 12,000 steps she loses weight. This is useful information for her. Furthermore she is enjoying taking although steps. This is good news  She derives some reassurance from coming here and a yearly basis. That is useful for her. Accordingly she will see me again in 1 year  She knows to call for any problems that may develop before that visit.   Kito Cuffe, Virgie Dad, MD  12/23/16 3:38 PM Medical Oncology and Hematology [facility] [address] [place], [place] [address] Tel. [phone]    Fax. [phone]  This document serves as a record of services personally performed by Lurline Del, MD. It was created on her behalf by Steva Colder, a trained medical scribe. The creation of this record is based on the scribe's personal observations and the provider's statements to them. This document has been checked and approved by the attending provider.

## 2017-01-01 DIAGNOSIS — E049 Nontoxic goiter, unspecified: Secondary | ICD-10-CM | POA: Diagnosis not present

## 2017-01-01 DIAGNOSIS — Z808 Family history of malignant neoplasm of other organs or systems: Secondary | ICD-10-CM | POA: Diagnosis not present

## 2017-01-22 ENCOUNTER — Ambulatory Visit
Admission: RE | Admit: 2017-01-22 | Discharge: 2017-01-22 | Disposition: A | Discharge status: Home / Self Care | Payer: Medicare Other | Source: Ambulatory Visit | Attending: Endocrinology | Admitting: Endocrinology

## 2017-01-22 DIAGNOSIS — E041 Nontoxic single thyroid nodule: Secondary | ICD-10-CM | POA: Diagnosis not present

## 2017-01-22 DIAGNOSIS — E049 Nontoxic goiter, unspecified: Secondary | ICD-10-CM

## 2017-03-19 DIAGNOSIS — D225 Melanocytic nevi of trunk: Secondary | ICD-10-CM | POA: Diagnosis not present

## 2017-03-19 DIAGNOSIS — L400 Psoriasis vulgaris: Secondary | ICD-10-CM | POA: Diagnosis not present

## 2017-03-19 DIAGNOSIS — Z85828 Personal history of other malignant neoplasm of skin: Secondary | ICD-10-CM | POA: Diagnosis not present

## 2017-03-19 DIAGNOSIS — Z79899 Other long term (current) drug therapy: Secondary | ICD-10-CM | POA: Diagnosis not present

## 2017-03-19 DIAGNOSIS — D224 Melanocytic nevi of scalp and neck: Secondary | ICD-10-CM | POA: Diagnosis not present

## 2017-03-19 DIAGNOSIS — L821 Other seborrheic keratosis: Secondary | ICD-10-CM | POA: Diagnosis not present

## 2017-06-10 ENCOUNTER — Other Ambulatory Visit: Payer: Self-pay | Admitting: Endocrinology

## 2017-06-10 DIAGNOSIS — Z139 Encounter for screening, unspecified: Secondary | ICD-10-CM

## 2017-06-15 NOTE — Progress Notes (Signed)
This encounter was created in error - please disregard.

## 2017-06-18 DIAGNOSIS — Z79899 Other long term (current) drug therapy: Secondary | ICD-10-CM | POA: Diagnosis not present

## 2017-06-18 DIAGNOSIS — L400 Psoriasis vulgaris: Secondary | ICD-10-CM | POA: Diagnosis not present

## 2017-06-18 DIAGNOSIS — Z85828 Personal history of other malignant neoplasm of skin: Secondary | ICD-10-CM | POA: Diagnosis not present

## 2017-06-29 ENCOUNTER — Encounter: Payer: Self-pay | Admitting: Radiology

## 2017-06-29 ENCOUNTER — Ambulatory Visit
Admission: RE | Admit: 2017-06-29 | Discharge: 2017-06-29 | Disposition: A | Discharge status: Home / Self Care | Payer: Medicare Other | Source: Ambulatory Visit | Attending: Endocrinology | Admitting: Endocrinology

## 2017-06-29 DIAGNOSIS — Z1231 Encounter for screening mammogram for malignant neoplasm of breast: Secondary | ICD-10-CM | POA: Diagnosis not present

## 2017-06-29 DIAGNOSIS — Z139 Encounter for screening, unspecified: Secondary | ICD-10-CM

## 2017-06-29 HISTORY — DX: Personal history of antineoplastic chemotherapy: Z92.21

## 2017-06-29 HISTORY — DX: Personal history of irradiation: Z92.3

## 2017-09-09 DIAGNOSIS — Z0000 Encounter for general adult medical examination without abnormal findings: Secondary | ICD-10-CM | POA: Diagnosis not present

## 2017-09-09 DIAGNOSIS — E782 Mixed hyperlipidemia: Secondary | ICD-10-CM | POA: Diagnosis not present

## 2017-09-09 DIAGNOSIS — J309 Allergic rhinitis, unspecified: Secondary | ICD-10-CM | POA: Diagnosis not present

## 2017-09-09 DIAGNOSIS — Z136 Encounter for screening for cardiovascular disorders: Secondary | ICD-10-CM | POA: Diagnosis not present

## 2017-10-13 DIAGNOSIS — Z79899 Other long term (current) drug therapy: Secondary | ICD-10-CM | POA: Diagnosis not present

## 2017-10-13 DIAGNOSIS — L821 Other seborrheic keratosis: Secondary | ICD-10-CM | POA: Diagnosis not present

## 2017-10-13 DIAGNOSIS — L400 Psoriasis vulgaris: Secondary | ICD-10-CM | POA: Diagnosis not present

## 2017-10-13 DIAGNOSIS — Z85828 Personal history of other malignant neoplasm of skin: Secondary | ICD-10-CM | POA: Diagnosis not present

## 2017-10-13 DIAGNOSIS — L570 Actinic keratosis: Secondary | ICD-10-CM | POA: Diagnosis not present

## 2017-12-21 ENCOUNTER — Telehealth: Payer: Self-pay | Admitting: Oncology

## 2017-12-21 NOTE — Telephone Encounter (Signed)
GM PAL 9/24 - per GM moved f/u to [facility]. Left message. Schedule mailed.

## 2017-12-25 ENCOUNTER — Telehealth: Payer: Self-pay

## 2017-12-25 NOTE — Telephone Encounter (Signed)
Unable to leave a message. Per 9/20 los

## 2017-12-28 ENCOUNTER — Telehealth: Payer: Self-pay | Admitting: Oncology

## 2017-12-28 ENCOUNTER — Other Ambulatory Visit: Payer: Self-pay | Admitting: Adult Health

## 2017-12-28 DIAGNOSIS — H40013 Open angle with borderline findings, low risk, bilateral: Secondary | ICD-10-CM | POA: Diagnosis not present

## 2017-12-28 DIAGNOSIS — Z171 Estrogen receptor negative status [ER-]: Principal | ICD-10-CM

## 2017-12-28 DIAGNOSIS — C50911 Malignant neoplasm of unspecified site of right female breast: Secondary | ICD-10-CM

## 2017-12-28 DIAGNOSIS — D3130 Benign neoplasm of unspecified choroid: Secondary | ICD-10-CM | POA: Diagnosis not present

## 2017-12-28 DIAGNOSIS — H2513 Age-related nuclear cataract, bilateral: Secondary | ICD-10-CM | POA: Diagnosis not present

## 2017-12-28 DIAGNOSIS — C50811 Malignant neoplasm of overlapping sites of right female breast: Secondary | ICD-10-CM

## 2017-12-28 DIAGNOSIS — H0289 Other specified disorders of eyelid: Secondary | ICD-10-CM | POA: Diagnosis not present

## 2017-12-28 NOTE — Telephone Encounter (Signed)
Returned call to patient re message left about rescheduling appointments. Spoke with patient re why appointments needed to be rescheduled. Per patient changed 9/24 lab to 3:15 pm and moved 9/24 f/u with LC to 10/4. Patient request lab/fu be one at least a week apart. Dates/times per patient due to work schedule.

## 2017-12-29 ENCOUNTER — Inpatient Hospital Stay: Payer: Medicare Other | Attending: Adult Health

## 2017-12-29 ENCOUNTER — Ambulatory Visit: Payer: Medicare Other | Admitting: Adult Health

## 2017-12-29 DIAGNOSIS — C50811 Malignant neoplasm of overlapping sites of right female breast: Secondary | ICD-10-CM

## 2017-12-29 DIAGNOSIS — Z171 Estrogen receptor negative status [ER-]: Secondary | ICD-10-CM

## 2017-12-29 DIAGNOSIS — Z853 Personal history of malignant neoplasm of breast: Secondary | ICD-10-CM | POA: Diagnosis not present

## 2017-12-29 DIAGNOSIS — C50911 Malignant neoplasm of unspecified site of right female breast: Secondary | ICD-10-CM

## 2017-12-29 LAB — CBC WITH DIFFERENTIAL (CANCER CENTER ONLY)
BASOS ABS: 0 10*3/uL (ref 0.0–0.1)
BASOS PCT: 1 %
EOS ABS: 0.1 10*3/uL (ref 0.0–0.5)
EOS PCT: 2 %
HCT: 39.7 % (ref 34.8–46.6)
Hemoglobin: 12.8 g/dL (ref 11.6–15.9)
Lymphocytes Relative: 24 %
Lymphs Abs: 1.8 10*3/uL (ref 0.9–3.3)
MCH: 32 pg (ref 25.1–34.0)
MCHC: 32.2 g/dL (ref 31.5–36.0)
MCV: 99.3 fL (ref 79.5–101.0)
MONO ABS: 0.8 10*3/uL (ref 0.1–0.9)
Monocytes Relative: 10 %
Neutro Abs: 4.8 10*3/uL (ref 1.5–6.5)
Neutrophils Relative %: 63 %
Platelet Count: 222 10*3/uL (ref 145–400)
RBC: 4 MIL/uL (ref 3.70–5.45)
RDW: 13.2 % (ref 11.2–14.5)
WBC Count: 7.5 10*3/uL (ref 3.9–10.3)

## 2017-12-29 LAB — CMP (CANCER CENTER ONLY)
ALBUMIN: 3.7 g/dL (ref 3.5–5.0)
ALT: 13 U/L (ref 0–44)
ANION GAP: 6 (ref 5–15)
AST: 16 U/L (ref 15–41)
Alkaline Phosphatase: 108 U/L (ref 38–126)
BUN: 17 mg/dL (ref 8–23)
CHLORIDE: 108 mmol/L (ref 98–111)
CO2: 31 mmol/L (ref 22–32)
Calcium: 9.2 mg/dL (ref 8.9–10.3)
Creatinine: 0.65 mg/dL (ref 0.44–1.00)
GFR, Estimated: >60 mL/min (ref >60)
Glucose, Bld: 92 mg/dL (ref 70–99)
Potassium: 4 mmol/L (ref 3.5–5.1)
SODIUM: 145 mmol/L (ref 135–145)
Total Bilirubin: <0.2 mg/dL — ABNORMAL LOW (ref 0.3–1.2)
Total Protein: 7.4 g/dL (ref 6.5–8.1)

## 2018-01-06 ENCOUNTER — Telehealth: Payer: Self-pay | Admitting: Oncology

## 2018-01-06 NOTE — Telephone Encounter (Signed)
Patient called to reschedule she wanted MD his next was 11/26 at 2pm there was nothing after 3pm per patient request when she calls back

## 2018-01-08 ENCOUNTER — Ambulatory Visit: Payer: Medicare Other | Admitting: Adult Health

## 2018-02-08 DIAGNOSIS — Z85828 Personal history of other malignant neoplasm of skin: Secondary | ICD-10-CM | POA: Diagnosis not present

## 2018-02-08 DIAGNOSIS — K130 Diseases of lips: Secondary | ICD-10-CM | POA: Diagnosis not present

## 2018-02-08 DIAGNOSIS — Z79899 Other long term (current) drug therapy: Secondary | ICD-10-CM | POA: Diagnosis not present

## 2018-02-08 DIAGNOSIS — L400 Psoriasis vulgaris: Secondary | ICD-10-CM | POA: Diagnosis not present

## 2018-02-25 NOTE — Progress Notes (Signed)
No show

## 2018-03-02 ENCOUNTER — Inpatient Hospital Stay: Payer: Medicare Other | Attending: Adult Health | Admitting: Oncology

## 2018-03-02 ENCOUNTER — Encounter: Payer: Self-pay | Admitting: Oncology

## 2018-05-13 DIAGNOSIS — Z85828 Personal history of other malignant neoplasm of skin: Secondary | ICD-10-CM | POA: Diagnosis not present

## 2018-05-13 DIAGNOSIS — Z79899 Other long term (current) drug therapy: Secondary | ICD-10-CM | POA: Diagnosis not present

## 2018-05-13 DIAGNOSIS — L400 Psoriasis vulgaris: Secondary | ICD-10-CM | POA: Diagnosis not present

## 2018-05-18 DIAGNOSIS — M79641 Pain in right hand: Secondary | ICD-10-CM | POA: Diagnosis not present

## 2018-05-18 DIAGNOSIS — S63311A Traumatic rupture of collateral ligament of right wrist, initial encounter: Secondary | ICD-10-CM | POA: Diagnosis not present

## 2018-06-15 DIAGNOSIS — S6991XA Unspecified injury of right wrist, hand and finger(s), initial encounter: Secondary | ICD-10-CM | POA: Diagnosis not present

## 2018-06-22 DIAGNOSIS — M79641 Pain in right hand: Secondary | ICD-10-CM | POA: Diagnosis not present

## 2018-06-22 DIAGNOSIS — M25641 Stiffness of right hand, not elsewhere classified: Secondary | ICD-10-CM | POA: Diagnosis not present

## 2018-06-22 DIAGNOSIS — S63319A Traumatic rupture of collateral ligament of unspecified wrist, initial encounter: Secondary | ICD-10-CM | POA: Diagnosis not present

## 2018-06-22 DIAGNOSIS — M7989 Other specified soft tissue disorders: Secondary | ICD-10-CM | POA: Diagnosis not present

## 2018-08-17 DIAGNOSIS — Z1239 Encounter for other screening for malignant neoplasm of breast: Secondary | ICD-10-CM | POA: Diagnosis not present

## 2018-08-17 DIAGNOSIS — Z808 Family history of malignant neoplasm of other organs or systems: Secondary | ICD-10-CM | POA: Diagnosis not present

## 2018-08-17 DIAGNOSIS — E049 Nontoxic goiter, unspecified: Secondary | ICD-10-CM | POA: Diagnosis not present

## 2018-08-17 DIAGNOSIS — M899 Disorder of bone, unspecified: Secondary | ICD-10-CM | POA: Diagnosis not present

## 2018-08-18 ENCOUNTER — Other Ambulatory Visit: Payer: Self-pay | Admitting: Endocrinology

## 2018-08-18 DIAGNOSIS — Z1231 Encounter for screening mammogram for malignant neoplasm of breast: Secondary | ICD-10-CM

## 2018-08-18 DIAGNOSIS — R5381 Other malaise: Secondary | ICD-10-CM

## 2018-08-18 DIAGNOSIS — E049 Nontoxic goiter, unspecified: Secondary | ICD-10-CM

## 2018-08-19 ENCOUNTER — Other Ambulatory Visit: Payer: Self-pay | Admitting: Endocrinology

## 2018-08-19 DIAGNOSIS — M79641 Pain in right hand: Secondary | ICD-10-CM | POA: Diagnosis not present

## 2018-08-19 DIAGNOSIS — L821 Other seborrheic keratosis: Secondary | ICD-10-CM | POA: Diagnosis not present

## 2018-08-19 DIAGNOSIS — D1801 Hemangioma of skin and subcutaneous tissue: Secondary | ICD-10-CM | POA: Diagnosis not present

## 2018-08-19 DIAGNOSIS — Z85828 Personal history of other malignant neoplasm of skin: Secondary | ICD-10-CM | POA: Diagnosis not present

## 2018-08-19 DIAGNOSIS — L400 Psoriasis vulgaris: Secondary | ICD-10-CM | POA: Diagnosis not present

## 2018-08-19 DIAGNOSIS — L820 Inflamed seborrheic keratosis: Secondary | ICD-10-CM | POA: Diagnosis not present

## 2018-08-19 DIAGNOSIS — D225 Melanocytic nevi of trunk: Secondary | ICD-10-CM | POA: Diagnosis not present

## 2018-08-19 DIAGNOSIS — M8580 Other specified disorders of bone density and structure, unspecified site: Secondary | ICD-10-CM

## 2018-08-19 DIAGNOSIS — L570 Actinic keratosis: Secondary | ICD-10-CM | POA: Diagnosis not present

## 2018-08-19 DIAGNOSIS — L812 Freckles: Secondary | ICD-10-CM | POA: Diagnosis not present

## 2018-08-19 DIAGNOSIS — Z79899 Other long term (current) drug therapy: Secondary | ICD-10-CM | POA: Diagnosis not present

## 2018-08-20 DIAGNOSIS — M79641 Pain in right hand: Secondary | ICD-10-CM | POA: Diagnosis not present

## 2018-09-08 DIAGNOSIS — M79641 Pain in right hand: Secondary | ICD-10-CM | POA: Diagnosis not present

## 2018-09-08 DIAGNOSIS — M25441 Effusion, right hand: Secondary | ICD-10-CM | POA: Diagnosis not present

## 2018-09-08 DIAGNOSIS — M79644 Pain in right finger(s): Secondary | ICD-10-CM | POA: Diagnosis not present

## 2018-09-10 ENCOUNTER — Ambulatory Visit
Admission: RE | Admit: 2018-09-10 | Discharge: 2018-09-10 | Disposition: A | Discharge status: Home / Self Care | Payer: Medicare Other | Source: Ambulatory Visit | Attending: Endocrinology | Admitting: Endocrinology

## 2018-09-10 DIAGNOSIS — M79641 Pain in right hand: Secondary | ICD-10-CM | POA: Diagnosis not present

## 2018-09-10 DIAGNOSIS — M25441 Effusion, right hand: Secondary | ICD-10-CM | POA: Diagnosis not present

## 2018-09-10 DIAGNOSIS — E041 Nontoxic single thyroid nodule: Secondary | ICD-10-CM | POA: Diagnosis not present

## 2018-09-10 DIAGNOSIS — L732 Hidradenitis suppurativa: Secondary | ICD-10-CM | POA: Diagnosis not present

## 2018-09-10 DIAGNOSIS — E049 Nontoxic goiter, unspecified: Secondary | ICD-10-CM

## 2018-09-10 DIAGNOSIS — M79644 Pain in right finger(s): Secondary | ICD-10-CM | POA: Diagnosis not present

## 2018-09-10 DIAGNOSIS — M25641 Stiffness of right hand, not elsewhere classified: Secondary | ICD-10-CM | POA: Diagnosis not present

## 2018-09-13 DIAGNOSIS — M79641 Pain in right hand: Secondary | ICD-10-CM | POA: Diagnosis not present

## 2018-09-13 DIAGNOSIS — M25641 Stiffness of right hand, not elsewhere classified: Secondary | ICD-10-CM | POA: Diagnosis not present

## 2018-09-13 DIAGNOSIS — M25441 Effusion, right hand: Secondary | ICD-10-CM | POA: Diagnosis not present

## 2018-09-13 DIAGNOSIS — M79644 Pain in right finger(s): Secondary | ICD-10-CM | POA: Diagnosis not present

## 2018-10-19 DIAGNOSIS — M25641 Stiffness of right hand, not elsewhere classified: Secondary | ICD-10-CM | POA: Diagnosis not present

## 2018-10-19 DIAGNOSIS — M79644 Pain in right finger(s): Secondary | ICD-10-CM | POA: Diagnosis not present

## 2018-10-19 DIAGNOSIS — M79641 Pain in right hand: Secondary | ICD-10-CM | POA: Diagnosis not present

## 2018-10-19 DIAGNOSIS — M25441 Effusion, right hand: Secondary | ICD-10-CM | POA: Diagnosis not present

## 2018-10-21 DIAGNOSIS — M79641 Pain in right hand: Secondary | ICD-10-CM | POA: Diagnosis not present

## 2018-10-21 DIAGNOSIS — S63319A Traumatic rupture of collateral ligament of unspecified wrist, initial encounter: Secondary | ICD-10-CM | POA: Diagnosis not present

## 2018-10-21 DIAGNOSIS — M25641 Stiffness of right hand, not elsewhere classified: Secondary | ICD-10-CM | POA: Diagnosis not present

## 2018-10-21 DIAGNOSIS — M79644 Pain in right finger(s): Secondary | ICD-10-CM | POA: Diagnosis not present

## 2018-10-21 DIAGNOSIS — M25441 Effusion, right hand: Secondary | ICD-10-CM | POA: Diagnosis not present

## 2018-10-27 DIAGNOSIS — M79644 Pain in right finger(s): Secondary | ICD-10-CM | POA: Diagnosis not present

## 2018-11-02 DIAGNOSIS — M79641 Pain in right hand: Secondary | ICD-10-CM | POA: Diagnosis not present

## 2018-11-02 DIAGNOSIS — S63319A Traumatic rupture of collateral ligament of unspecified wrist, initial encounter: Secondary | ICD-10-CM | POA: Diagnosis not present

## 2018-11-22 ENCOUNTER — Ambulatory Visit
Admission: RE | Admit: 2018-11-22 | Discharge: 2018-11-22 | Disposition: A | Discharge status: Home / Self Care | Payer: Medicare Other | Source: Ambulatory Visit | Attending: Endocrinology | Admitting: Endocrinology

## 2018-11-22 ENCOUNTER — Other Ambulatory Visit: Payer: Self-pay

## 2018-11-22 DIAGNOSIS — M8580 Other specified disorders of bone density and structure, unspecified site: Secondary | ICD-10-CM

## 2018-11-22 DIAGNOSIS — M85852 Other specified disorders of bone density and structure, left thigh: Secondary | ICD-10-CM | POA: Diagnosis not present

## 2018-11-22 DIAGNOSIS — Z1231 Encounter for screening mammogram for malignant neoplasm of breast: Secondary | ICD-10-CM | POA: Diagnosis not present

## 2018-11-22 DIAGNOSIS — Z780 Asymptomatic menopausal state: Secondary | ICD-10-CM | POA: Diagnosis not present

## 2018-11-25 DIAGNOSIS — Z85828 Personal history of other malignant neoplasm of skin: Secondary | ICD-10-CM | POA: Diagnosis not present

## 2018-11-25 DIAGNOSIS — L400 Psoriasis vulgaris: Secondary | ICD-10-CM | POA: Diagnosis not present

## 2018-11-25 DIAGNOSIS — Z79899 Other long term (current) drug therapy: Secondary | ICD-10-CM | POA: Diagnosis not present

## 2018-12-29 DIAGNOSIS — D3131 Benign neoplasm of right choroid: Secondary | ICD-10-CM | POA: Diagnosis not present

## 2018-12-29 DIAGNOSIS — H04123 Dry eye syndrome of bilateral lacrimal glands: Secondary | ICD-10-CM | POA: Diagnosis not present

## 2018-12-29 DIAGNOSIS — H2513 Age-related nuclear cataract, bilateral: Secondary | ICD-10-CM | POA: Diagnosis not present

## 2018-12-29 DIAGNOSIS — D3132 Benign neoplasm of left choroid: Secondary | ICD-10-CM | POA: Diagnosis not present

## 2018-12-29 DIAGNOSIS — H40013 Open angle with borderline findings, low risk, bilateral: Secondary | ICD-10-CM | POA: Diagnosis not present

## 2019-02-08 DIAGNOSIS — Z0000 Encounter for general adult medical examination without abnormal findings: Secondary | ICD-10-CM | POA: Diagnosis not present

## 2019-02-08 DIAGNOSIS — E782 Mixed hyperlipidemia: Secondary | ICD-10-CM | POA: Diagnosis not present

## 2019-02-08 DIAGNOSIS — J309 Allergic rhinitis, unspecified: Secondary | ICD-10-CM | POA: Diagnosis not present

## 2019-02-08 DIAGNOSIS — Z23 Encounter for immunization: Secondary | ICD-10-CM | POA: Diagnosis not present

## 2019-03-09 DIAGNOSIS — M25572 Pain in left ankle and joints of left foot: Secondary | ICD-10-CM | POA: Diagnosis not present

## 2019-03-09 DIAGNOSIS — D485 Neoplasm of uncertain behavior of skin: Secondary | ICD-10-CM | POA: Diagnosis not present

## 2019-03-09 DIAGNOSIS — D0471 Carcinoma in situ of skin of right lower limb, including hip: Secondary | ICD-10-CM | POA: Diagnosis not present

## 2019-03-09 DIAGNOSIS — Z79899 Other long term (current) drug therapy: Secondary | ICD-10-CM | POA: Diagnosis not present

## 2019-03-09 DIAGNOSIS — Z85828 Personal history of other malignant neoplasm of skin: Secondary | ICD-10-CM | POA: Diagnosis not present

## 2019-03-09 DIAGNOSIS — L400 Psoriasis vulgaris: Secondary | ICD-10-CM | POA: Diagnosis not present

## 2019-03-21 DIAGNOSIS — M25572 Pain in left ankle and joints of left foot: Secondary | ICD-10-CM | POA: Diagnosis not present

## 2019-04-20 DIAGNOSIS — M25572 Pain in left ankle and joints of left foot: Secondary | ICD-10-CM | POA: Diagnosis not present

## 2019-04-29 DIAGNOSIS — M542 Cervicalgia: Secondary | ICD-10-CM | POA: Diagnosis not present

## 2019-04-29 DIAGNOSIS — M25512 Pain in left shoulder: Secondary | ICD-10-CM | POA: Diagnosis not present

## 2019-05-02 ENCOUNTER — Ambulatory Visit: Payer: Medicare Other | Attending: Internal Medicine

## 2019-05-02 DIAGNOSIS — Z23 Encounter for immunization: Secondary | ICD-10-CM | POA: Insufficient documentation

## 2019-05-02 NOTE — Progress Notes (Signed)
   Covid-19 Vaccination Clinic  Name:  Julie Cortez    MRN: [id number] DOB: [birth year]  05/02/2019  [name] was observed post Covid-19 immunization for 15 minutes without incidence. She was provided with Vaccine Information Sheet and instruction to access the V-Safe system.   [name] was instructed to call 911 with any severe reactions post vaccine: Marland Kitchen Difficulty breathing  . Swelling of your face and throat  . A fast heartbeat  . A bad rash all over your body  . Dizziness and weakness    Immunizations Administered    Name Date Dose VIS Date Route   Pfizer COVID-19 Vaccine 05/02/2019  4:23 PM 0.3 mL 03/18/2019 Intramuscular   Manufacturer: [place]   Lot: [id number]   [place]: [id number]

## 2019-05-10 ENCOUNTER — Other Ambulatory Visit: Payer: Self-pay | Admitting: *Deleted

## 2019-05-10 DIAGNOSIS — C50911 Malignant neoplasm of unspecified site of right female breast: Secondary | ICD-10-CM

## 2019-05-10 DIAGNOSIS — M549 Dorsalgia, unspecified: Secondary | ICD-10-CM

## 2019-05-11 ENCOUNTER — Inpatient Hospital Stay: Payer: Medicare Other | Attending: Oncology

## 2019-05-11 ENCOUNTER — Other Ambulatory Visit: Payer: Self-pay

## 2019-05-11 ENCOUNTER — Ambulatory Visit (HOSPITAL_COMMUNITY)
Admission: RE | Admit: 2019-05-11 | Discharge: 2019-05-11 | Disposition: A | Discharge status: Home / Self Care | Payer: Medicare Other | Source: Ambulatory Visit | Attending: Oncology | Admitting: Oncology

## 2019-05-11 DIAGNOSIS — M549 Dorsalgia, unspecified: Secondary | ICD-10-CM

## 2019-05-11 DIAGNOSIS — Z808 Family history of malignant neoplasm of other organs or systems: Secondary | ICD-10-CM | POA: Diagnosis not present

## 2019-05-11 DIAGNOSIS — Z79899 Other long term (current) drug therapy: Secondary | ICD-10-CM | POA: Diagnosis not present

## 2019-05-11 DIAGNOSIS — Z801 Family history of malignant neoplasm of trachea, bronchus and lung: Secondary | ICD-10-CM | POA: Diagnosis not present

## 2019-05-11 DIAGNOSIS — M8580 Other specified disorders of bone density and structure, unspecified site: Secondary | ICD-10-CM | POA: Insufficient documentation

## 2019-05-11 DIAGNOSIS — Z9221 Personal history of antineoplastic chemotherapy: Secondary | ICD-10-CM | POA: Insufficient documentation

## 2019-05-11 DIAGNOSIS — Z171 Estrogen receptor negative status [ER-]: Secondary | ICD-10-CM | POA: Insufficient documentation

## 2019-05-11 DIAGNOSIS — Z87891 Personal history of nicotine dependence: Secondary | ICD-10-CM | POA: Diagnosis not present

## 2019-05-11 DIAGNOSIS — Z86711 Personal history of pulmonary embolism: Secondary | ICD-10-CM | POA: Insufficient documentation

## 2019-05-11 DIAGNOSIS — Z853 Personal history of malignant neoplasm of breast: Secondary | ICD-10-CM | POA: Insufficient documentation

## 2019-05-11 DIAGNOSIS — C50912 Malignant neoplasm of unspecified site of left female breast: Secondary | ICD-10-CM | POA: Diagnosis not present

## 2019-05-11 DIAGNOSIS — C50911 Malignant neoplasm of unspecified site of right female breast: Secondary | ICD-10-CM

## 2019-05-11 DIAGNOSIS — M25512 Pain in left shoulder: Secondary | ICD-10-CM | POA: Diagnosis not present

## 2019-05-11 DIAGNOSIS — E079 Disorder of thyroid, unspecified: Secondary | ICD-10-CM | POA: Insufficient documentation

## 2019-05-11 DIAGNOSIS — Z923 Personal history of irradiation: Secondary | ICD-10-CM | POA: Diagnosis not present

## 2019-05-11 LAB — CBC WITH DIFFERENTIAL (CANCER CENTER ONLY)
Abs Immature Granulocytes: 0.05 10*3/uL (ref 0.00–0.07)
Basophils Absolute: 0.1 10*3/uL (ref 0.0–0.1)
Basophils Relative: 1 %
Eosinophils Absolute: 0.1 10*3/uL (ref 0.0–0.5)
Eosinophils Relative: 1 %
HCT: 45.3 % (ref 36.0–46.0)
Hemoglobin: 14.6 g/dL (ref 12.0–15.0)
Immature Granulocytes: 0 %
Lymphocytes Relative: 14 %
Lymphs Abs: 1.7 10*3/uL (ref 0.7–4.0)
MCH: 32 pg (ref 26.0–34.0)
MCHC: 32.2 g/dL (ref 30.0–36.0)
MCV: 99.3 fL (ref 80.0–100.0)
Monocytes Absolute: 1 10*3/uL (ref 0.1–1.0)
Monocytes Relative: 8 %
Neutro Abs: 9.3 10*3/uL — ABNORMAL HIGH (ref 1.7–7.7)
Neutrophils Relative %: 76 %
Platelet Count: 290 10*3/uL (ref 150–400)
RBC: 4.56 MIL/uL (ref 3.87–5.11)
RDW: 13 % (ref 11.5–15.5)
WBC Count: 12.2 10*3/uL — ABNORMAL HIGH (ref 4.0–10.5)
nRBC: 0 % (ref 0.0–0.2)

## 2019-05-11 LAB — CMP (CANCER CENTER ONLY)
ALT: 13 U/L (ref 0–44)
AST: 13 U/L — ABNORMAL LOW (ref 15–41)
Albumin: 4 g/dL (ref 3.5–5.0)
Alkaline Phosphatase: 110 U/L (ref 38–126)
Anion gap: 11 (ref 5–15)
BUN: 20 mg/dL (ref 8–23)
CO2: 28 mmol/L (ref 22–32)
Calcium: 9.2 mg/dL (ref 8.9–10.3)
Chloride: 103 mmol/L (ref 98–111)
Creatinine: 0.76 mg/dL (ref 0.44–1.00)
GFR, Est AFR Am: >60 mL/min (ref >60)
GFR, Estimated: >60 mL/min (ref >60)
Glucose, Bld: 117 mg/dL — ABNORMAL HIGH (ref 70–99)
Potassium: 3.9 mmol/L (ref 3.5–5.1)
Sodium: 142 mmol/L (ref 135–145)
Total Bilirubin: 0.4 mg/dL (ref 0.3–1.2)
Total Protein: 7.8 g/dL (ref 6.5–8.1)

## 2019-05-11 NOTE — Progress Notes (Signed)
ID: Julie Cortez   DOB: [birth year]  MR#: [id number]  NWG#:[id number]  Patient Care Team: Julie Cortez, Julie Jews.Marlou Sa, MD as PCP - General (Family Medicine) Julie Matin, MD as Consulting Physician (Dermatology) Julie Pi, MD as Referring Physician (Endocrinology) Julie Cortez, Julie Dad, MD as Consulting Physician (Oncology) OTHER MD:   CHIEF COMPLAINT: triple negative breast cancer  CURRENT TREATMENT: observation   INTERVAL HISTORY: Julie Cortez returns today for followup of her triple negative breast cancer, accompanied by her husband Julie Cortez  Since her last visit, she underwent bilateral screening mammography with tomography at [facility] on 11/22/2018 showing: breast density category B; no evidence of malignancy in either breast.  She also underwent bone density screening on the same day. This showed a T-score of -1.5, which is considered osteopenic.   She also underwent thyroid ultrasound on 09/10/2018, which showed: 0.9 cm left inferior TR3 nodule (previously [place]) remains present, but does not meet criteria for biopsy or follow up; stable thyroid ultrasound.  She was also seen by orthopedics and physical therapy for complete tear of her right wrist radial collateral ligament.  This followed a fall.  The foot is now much better and she is getting ready to start her walking program but she has had significant left scapular/left shoulder pain and is here for further evaluation.   REVIEW OF SYSTEMS: Julie Cortez tells me that when she lies down and puts her arm up towards her ear then she has no pain in her left scapula or left shoulder or arm.  When he she is active and the arm is dangling down then she does have pain.  This is actually quite limiting to her.  She is not taking pain medicine for this except an occasional Tylenol which is not working very well.  She has had no unusual headaches, is able to turn her neck fairly well although she says sometimes when she is driving she finds rotation a little  bit difficult again as mentioned above the foot is much better and if she did not have the shoulder pain she would be able to return to her usual activities.  She has had no fever, no trauma, has not been doing any unusual exercises and overall a detailed review of systems today was noncontributory except as noted eceived her first COVID-19 vaccine dose on 05/02/2019.  HISTORY OF PRESENT ILLNESS: From the original intake note:  Julie Cortez had neglected her health maintenance and she had not had a mammogram  before Christmas 2005, when she noted a mass in her right breast. She eventually brought this to Dr. Virgilio Cortez attention (she did not have a primary doctor at that time, but Dr. Alroy Cortez takes care of her husband, Julie Cortez) and he set her up for mammography on April 04, 2004. She had a palpable mass at the 12 o'clock position in the right breast and by mammogram, this was spiculated without a malignant type microcalcification. The ultrasound showed the mass to be approximately 2.1 cm. Biopsy was performed the same day and showed ([id number]) invasive mammary carcinoma, which was ER/PR and HER-2 negative.    With this information, the patient was referred to Dr. Marylene Cortez who on January 11 proceed to a right lumpectomy with sentinel lymph node biopsy. That report ([id number]) shows a multifocal tumor, the largest mass being 4.2 cm, two other masses being 1.2 and 0.7 mm, grade 3, with  0 of 2 lymph nodes involved. There was extensive lymphovascular invasion and the margins were positive.  Accordingly, Dr. Annamaria Cortez went  back on January 30th and the final margins were negative, but still close at 1 mm superiorly.  There was also extensive LVI noted again in this sample.   Julie Cortez subsequent history is as detailed below.   PAST MEDICAL HISTORY: Past Medical History:  Diagnosis Date  . Breast cancer [facility]) 2006   right Breast  . Personal history of chemotherapy   . Personal history of radiation therapy   .  Thyroid disease    goiter   psoriasis, history of postpartum pulmonary embolus, history of tobacco abuse, history of Clomid use in the late 70s, history of anxiety /depression and history of left breast cyst aspiration.    PAST SURGICAL HISTORY: Past Surgical History:  Procedure Laterality Date  . BREAST LUMPECTOMY  2006   right breast  . CYSTECTOMY  2006  . laproscopic cyst removal  March 2006    FAMILY HISTORY Family History  Problem Relation Age of Onset  . Lung cancer Father        Lung  . Brain cancer Mother        brain  Her father died from widely disseminated cancer at the age of 37. They really do not know what the primary site was. It was quite widespread at the time of diagnosis and he died within six days of diagnosis. Her mother had superficial bladder cancer and then developed a central nervous system non-Hodgkin's lymphoma.  She died within six months of diagnosis.  The patient has one brother and one sister.  There is no history of breast cancer in the family and no history of ovarian cancer in the family.   GYNECOLOGIC HISTORY: She is G3, P3. First delivery age 45, last menstrual period 66. She did not have significant problems with hot flashes and never took hormone replacement therapy.    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  Her husband, Julie Cortez, is present today. He owns and runs a Hotel manager. Their three children are Julie Cortez, who lives in [place] and is in Press photographer; Julie Cortez, who lives in [place] and works for [employer]; and Julie Cortez, their son 21 who is junior in [place] and [place] for the school.  She is a member of [employer].     ADVANCED DIRECTIVES: in place   HEALTH MAINTENANCE: Social History   Tobacco Use  . Smoking status: Former Smoker    Quit date: 04/07/2004    Years since quitting: 15.1  Substance Use Topics   . Alcohol use: Yes  . Drug use: No     Colonoscopy:  PAP:  Bone density:  Lipid panel:  No Known Allergies  Current Outpatient Medications  Medication Sig Dispense Refill  . ACITRETIN PO Take by mouth.    Marland Kitchen CALCIUM-VITAMIN D PO Take 500 mg by mouth 2 (two) times daily.      Marland Kitchen levothyroxine (SYNTHROID, LEVOTHROID) 50 MCG tablet Take 50 mcg by mouth daily.       No current facility-administered medications for this visit.    OBJECTIVE: Middle-aged white woman in no acute distress Vitals:   05/12/19 0853  BP: 125/84  Pulse: 100  Resp: 18  Temp: 97.8 F (36.6 C)  SpO2: 100%     Body mass index is 25.19 kg/m.    ECOG FS: 1  Sclerae unicteric, EOMs intact Wearing a mask No cervical or supraclavicular adenopathy Lungs no rales or  rhonchi Heart regular rate and rhythm Abd soft, nontender, positive bowel sounds MSK no focal spinal tenderness, no left upper extremity lymphedema, good range of motion on the left shoulder, no palpable or visible abnormality in the left shoulder or left scapular area Neuro: nonfocal, well oriented, appropriate affect Breasts: Status post right lumpectomy and radiation with minimal distortion of the breast contour, unchanged from baseline; left breast is benign.  Both axillae are benign.  LAB RESULTS: Lab Results  Component Value Date   WBC 12.2 (H) 05/11/2019   NEUTROABS 9.3 (H) 05/11/2019   HGB 14.6 05/11/2019   HCT 45.3 05/11/2019   MCV 99.3 05/11/2019   PLT 290 05/11/2019      Chemistry      Component Value Date/Time   NA 142 05/11/2019 1500   NA 144 12/19/2016 1045   K 3.9 05/11/2019 1500   K 4.2 12/19/2016 1045   CL 103 05/11/2019 1500   CL 106 09/21/2012 1503   CO2 28 05/11/2019 1500   CO2 28 12/19/2016 1045   BUN 20 05/11/2019 1500   BUN 16.6 12/19/2016 1045   CREATININE 0.76 05/11/2019 1500   CREATININE 0.7 12/19/2016 1045      Component Value Date/Time   CALCIUM 9.2 05/11/2019 1500   CALCIUM 9.5 12/19/2016 1045    ALKPHOS 110 05/11/2019 1500   ALKPHOS 97 12/19/2016 1045   AST 13 (L) 05/11/2019 1500   AST 16 12/19/2016 1045   ALT 13 05/11/2019 1500   ALT 10 12/19/2016 1045   BILITOT 0.4 05/11/2019 1500   BILITOT 0.32 12/19/2016 1045       Lab Results  Component Value Date   LABCA2 25 09/16/2011    No components found for: [id number]  No results for input(s): INR in the last 168 hours.  Urinalysis No results found for: COLORURINE  STUDIES: DG Chest 2 View  Result Date: 05/12/2019 CLINICAL DATA:  Back/left scapular pain. No injury. History of breast cancer. EXAM: CHEST - 2 VIEW COMPARISON:  CT chest 06/11/2006. FINDINGS: Mediastinum hilar structures normal. Lungs are clear of acute infiltrates. Mild left base pleural-parenchymal thickening again noted consistent with scarring. No pleural effusion or pneumothorax. Heart size normal. Thoracic spine scoliosis concave right. Surgical clips noted over the right chest. Diffuse osteopenia. Thoracic spine scoliosis concave left. No acute bony abnormality. IMPRESSION: No acute cardiopulmonary disease. Thoracic spine scoliosis concave left. No acute bony abnormality identified. Electronically Signed   By: Marcello Moores  Register   On: 05/12/2019 07:45   DG Scapula Left  Result Date: 05/12/2019 CLINICAL DATA:  69 year old female with unresolved scapula pain on going for several weeks. Breast cancer. EXAM: LEFT SCAPULA - 2+ VIEWS COMPARISON:  Chest CT today reported separately. FINDINGS: Bone mineralization is within normal limits for age. No glenohumeral joint dislocation. Mild degenerative changes for age, most notable at the left [facility] joint. No acute osseous abnormality identified. Visible left ribs and chest appear negative. IMPRESSION: Negative for age, no radiographic explanation for pain. Electronically Signed   By: Genevie Ann M.D.   On: 05/12/2019 07:46     ASSESSMENT: 69 y.o.  woman status post right lumpectomy and sentinel lymph node biopsy January  2006 for a T2 N0 grade 3 invasive ductal carcinoma which was triple negative treated with Cytoxan and Adriamycin in dose dense fashion x4 then weekly Taxol x12, completed July 2006, then radiation completed September 2006.     PLAN:  Dawsyn is now 15 years out from definitive surgery for her breast cancer  with no evidence of disease recurrence.  This is very favorable.  I reassured her that I really do not believe what she is experiencing in the left upper arm left shoulder area is going to be related to her cancer and the plain films are certainly encouraging in that regard.  We discussed the fact that it is difficult to prove a negative but I think what she is most likely having is some nerve entrapment.  We are going to obtain a MRI of her cervical spine and also the left shoulder.  If that does not give [place] the answer we will go to a CT scan of the chest.  If we do document significant impingement issues she will referred to neurosurgery  In anticipation that we will have benign findings I am making her a return appointment with me in a year  She knows to call for any other issue that may develop before the next visit.  Total encounter time 30 minutes.*  Jasiya Markie, Julie Dad, MD  05/12/19 9:28 AM Medical Oncology and Hematology [facility], [place] [address] Tel. [phone]    Fax. [phone]   I, Wilburn Mylar, am acting as scribe for Dr. Virgie Cortez. Sigurd Pugh.  I, Lurline Del MD, have reviewed the above documentation for accuracy and completeness, and I agree with the above.    *Total Encounter Time as defined by the Centers for Medicare and Medicaid Services includes, in addition to the face-to-face time of a patient visit (documented in the note above) non-face-to-face time: obtaining and reviewing outside history, ordering and reviewing medications, tests or procedures, care coordination (communications with other health care  professionals or caregivers) and documentation in the medical record.

## 2019-05-12 ENCOUNTER — Inpatient Hospital Stay (HOSPITAL_BASED_OUTPATIENT_CLINIC_OR_DEPARTMENT_OTHER): Payer: Medicare Other | Admitting: Oncology

## 2019-05-12 ENCOUNTER — Other Ambulatory Visit: Payer: Self-pay

## 2019-05-12 VITALS — BP 125/84 | HR 100 | Temp 97.8°F | Resp 18 | Ht 63.75 in | Wt 145.6 lb

## 2019-05-12 DIAGNOSIS — Z171 Estrogen receptor negative status [ER-]: Secondary | ICD-10-CM | POA: Diagnosis not present

## 2019-05-12 DIAGNOSIS — M25512 Pain in left shoulder: Secondary | ICD-10-CM | POA: Diagnosis not present

## 2019-05-12 DIAGNOSIS — Z79899 Other long term (current) drug therapy: Secondary | ICD-10-CM | POA: Diagnosis not present

## 2019-05-12 DIAGNOSIS — M8580 Other specified disorders of bone density and structure, unspecified site: Secondary | ICD-10-CM | POA: Diagnosis not present

## 2019-05-12 DIAGNOSIS — Z853 Personal history of malignant neoplasm of breast: Secondary | ICD-10-CM | POA: Diagnosis not present

## 2019-05-12 DIAGNOSIS — C50811 Malignant neoplasm of overlapping sites of right female breast: Secondary | ICD-10-CM

## 2019-05-12 DIAGNOSIS — E079 Disorder of thyroid, unspecified: Secondary | ICD-10-CM | POA: Diagnosis not present

## 2019-05-13 ENCOUNTER — Telehealth: Payer: Self-pay | Admitting: Oncology

## 2019-05-13 NOTE — Telephone Encounter (Signed)
I talk with patient regarding schedule  

## 2019-05-23 ENCOUNTER — Ambulatory Visit: Payer: Medicare Other | Attending: Internal Medicine

## 2019-05-23 ENCOUNTER — Telehealth: Payer: Self-pay | Admitting: *Deleted

## 2019-05-23 DIAGNOSIS — Z23 Encounter for immunization: Secondary | ICD-10-CM | POA: Insufficient documentation

## 2019-05-23 NOTE — Telephone Encounter (Signed)
This RN spoke with pt per her call wanting to discuss scheduled MRI's for this Wednesday post visit last week and noted pain in back and shoulder.  Pain has improved - is no longer constant and stabbing - but"  intermittent and achy" , " more like I worked out too much"  Per discussion with MD- MRI can be held at this time. Pt is to continue to monitor and if needed will obtain MRI's at a later date.  Julie Cortez verbalized understanding and agreement with plan.

## 2019-05-23 NOTE — Progress Notes (Signed)
   Covid-19 Vaccination Clinic  Name:  Julie Cortez    MRN: [id number] DOB: Sep 01, 1950  05/23/2019  [name] was observed post Covid-19 immunization for 15 minutes without incidence. She was provided with Vaccine Information Sheet and instruction to access the V-Safe system.   [name] was instructed to call 911 with any severe reactions post vaccine: Marland Kitchen Difficulty breathing  . Swelling of your face and throat  . A fast heartbeat  . A bad rash all over your body  . Dizziness and weakness    Immunizations Administered    Name Date Dose VIS Date Route   Pfizer COVID-19 Vaccine 05/23/2019 12:53 PM 0.3 mL 03/18/2019 Intramuscular   Manufacturer: [place]   Lot: [id number]   [place]: [id number]

## 2019-05-25 ENCOUNTER — Ambulatory Visit (HOSPITAL_COMMUNITY): Payer: Medicare Other

## 2019-06-14 DIAGNOSIS — L821 Other seborrheic keratosis: Secondary | ICD-10-CM | POA: Diagnosis not present

## 2019-06-14 DIAGNOSIS — D225 Melanocytic nevi of trunk: Secondary | ICD-10-CM | POA: Diagnosis not present

## 2019-06-14 DIAGNOSIS — L400 Psoriasis vulgaris: Secondary | ICD-10-CM | POA: Diagnosis not present

## 2019-06-14 DIAGNOSIS — Z85828 Personal history of other malignant neoplasm of skin: Secondary | ICD-10-CM | POA: Diagnosis not present

## 2019-06-14 DIAGNOSIS — K130 Diseases of lips: Secondary | ICD-10-CM | POA: Diagnosis not present

## 2019-06-14 DIAGNOSIS — L570 Actinic keratosis: Secondary | ICD-10-CM | POA: Diagnosis not present

## 2019-06-14 DIAGNOSIS — D1801 Hemangioma of skin and subcutaneous tissue: Secondary | ICD-10-CM | POA: Diagnosis not present

## 2019-06-14 DIAGNOSIS — L438 Other lichen planus: Secondary | ICD-10-CM | POA: Diagnosis not present

## 2019-09-20 DIAGNOSIS — L03011 Cellulitis of right finger: Secondary | ICD-10-CM | POA: Diagnosis not present

## 2019-09-20 DIAGNOSIS — Z85828 Personal history of other malignant neoplasm of skin: Secondary | ICD-10-CM | POA: Diagnosis not present

## 2019-09-20 DIAGNOSIS — L400 Psoriasis vulgaris: Secondary | ICD-10-CM | POA: Diagnosis not present

## 2019-09-20 DIAGNOSIS — Z79899 Other long term (current) drug therapy: Secondary | ICD-10-CM | POA: Diagnosis not present

## 2019-09-20 DIAGNOSIS — L570 Actinic keratosis: Secondary | ICD-10-CM | POA: Diagnosis not present

## 2019-11-07 DIAGNOSIS — M7021 Olecranon bursitis, right elbow: Secondary | ICD-10-CM | POA: Diagnosis not present

## 2019-11-16 DIAGNOSIS — M25521 Pain in right elbow: Secondary | ICD-10-CM | POA: Diagnosis not present

## 2019-12-19 ENCOUNTER — Other Ambulatory Visit: Payer: Self-pay | Admitting: Endocrinology

## 2019-12-19 DIAGNOSIS — Z1231 Encounter for screening mammogram for malignant neoplasm of breast: Secondary | ICD-10-CM

## 2019-12-21 DIAGNOSIS — Z85828 Personal history of other malignant neoplasm of skin: Secondary | ICD-10-CM | POA: Diagnosis not present

## 2019-12-21 DIAGNOSIS — D692 Other nonthrombocytopenic purpura: Secondary | ICD-10-CM | POA: Diagnosis not present

## 2019-12-21 DIAGNOSIS — Z79899 Other long term (current) drug therapy: Secondary | ICD-10-CM | POA: Diagnosis not present

## 2019-12-21 DIAGNOSIS — L400 Psoriasis vulgaris: Secondary | ICD-10-CM | POA: Diagnosis not present

## 2019-12-21 DIAGNOSIS — L812 Freckles: Secondary | ICD-10-CM | POA: Diagnosis not present

## 2019-12-21 DIAGNOSIS — L817 Pigmented purpuric dermatosis: Secondary | ICD-10-CM | POA: Diagnosis not present

## 2020-01-05 ENCOUNTER — Other Ambulatory Visit: Payer: Self-pay

## 2020-01-05 ENCOUNTER — Ambulatory Visit
Admission: RE | Admit: 2020-01-05 | Discharge: 2020-01-05 | Disposition: A | Discharge status: Home / Self Care | Payer: Medicare Other | Source: Ambulatory Visit | Attending: Endocrinology | Admitting: Endocrinology

## 2020-01-05 DIAGNOSIS — Z1231 Encounter for screening mammogram for malignant neoplasm of breast: Secondary | ICD-10-CM | POA: Diagnosis not present

## 2020-01-12 ENCOUNTER — Telehealth: Payer: Self-pay | Admitting: *Deleted

## 2020-01-12 NOTE — Telephone Encounter (Signed)
Received vm call from pt asking about Breast Cancer Gene test for PALB2 that she has heard about.  She is asking if she needs this.  Message to Dr Jana Hakim.

## 2020-02-07 DIAGNOSIS — D3131 Benign neoplasm of right choroid: Secondary | ICD-10-CM | POA: Diagnosis not present

## 2020-02-07 DIAGNOSIS — D3132 Benign neoplasm of left choroid: Secondary | ICD-10-CM | POA: Diagnosis not present

## 2020-02-07 DIAGNOSIS — H04123 Dry eye syndrome of bilateral lacrimal glands: Secondary | ICD-10-CM | POA: Diagnosis not present

## 2020-02-07 DIAGNOSIS — H40013 Open angle with borderline findings, low risk, bilateral: Secondary | ICD-10-CM | POA: Diagnosis not present

## 2020-02-07 DIAGNOSIS — H2513 Age-related nuclear cataract, bilateral: Secondary | ICD-10-CM | POA: Diagnosis not present

## 2020-03-12 DIAGNOSIS — L400 Psoriasis vulgaris: Secondary | ICD-10-CM | POA: Diagnosis not present

## 2020-03-12 DIAGNOSIS — Z85828 Personal history of other malignant neoplasm of skin: Secondary | ICD-10-CM | POA: Diagnosis not present

## 2020-03-13 DIAGNOSIS — J309 Allergic rhinitis, unspecified: Secondary | ICD-10-CM | POA: Diagnosis not present

## 2020-03-13 DIAGNOSIS — Z0000 Encounter for general adult medical examination without abnormal findings: Secondary | ICD-10-CM | POA: Diagnosis not present

## 2020-03-13 DIAGNOSIS — L409 Psoriasis, unspecified: Secondary | ICD-10-CM | POA: Diagnosis not present

## 2020-03-13 DIAGNOSIS — E782 Mixed hyperlipidemia: Secondary | ICD-10-CM | POA: Diagnosis not present

## 2020-03-13 DIAGNOSIS — E049 Nontoxic goiter, unspecified: Secondary | ICD-10-CM | POA: Diagnosis not present

## 2020-04-21 ENCOUNTER — Other Ambulatory Visit: Payer: Medicare Other

## 2020-04-21 DIAGNOSIS — Z20822 Contact with and (suspected) exposure to covid-19: Secondary | ICD-10-CM | POA: Diagnosis not present

## 2020-04-24 LAB — NOVEL CORONAVIRUS, NAA: SARS-CoV-2, NAA: NOT DETECTED

## 2020-05-13 NOTE — Progress Notes (Signed)
ID: [name]   DOB: 02-04-1951  MR#: [id number]  EHU#:[id number]  Patient Care Team: Julie Cortez, Julie Jews.Marlou Sa, MD as PCP - General (Family Medicine) Julie Matin, MD as Consulting Physician (Dermatology) Julie Pi, MD as Referring Physician (Endocrinology) Julie Cortez, Julie Dad, MD as Consulting Physician (Oncology) OTHER MD:   CHIEF COMPLAINT: triple negative breast cancer  CURRENT TREATMENT: observation   INTERVAL HISTORY: Julie Cortez returns today for followup of her triple negative breast cancer, accompanied by her husband Julie Cortez. She continues under observation.  Since her last visit, she underwent bilateral screening mammography with tomography at [facility] on 01/05/2020 showing: breast density category A; no evidence of malignancy in either breast.   She also had plain films of the left scapula and a chest x-ray 05/12/2019 to evaluate some left scapular discomfort.  These were unremarkable.   REVIEW OF SYSTEMS: Julie Cortez is doing terrific.  She has an excellent diet and exercise program.  She walks about 5 miles most days.  She has no symptoms of concern.   COVID 19 VACCINATION STATUS: [place] x2, with booster November 2020   HISTORY OF PRESENT ILLNESS: From the original intake note:  Julie Cortez had neglected her health maintenance and she had not had a mammogram  before Christmas 2005, when she noted a mass in her right breast. She eventually brought this to Dr. Virgilio Cortez attention (she did not have a primary doctor at that time, but Dr. Alroy Cortez takes care of her husband, Julie Cortez) and he set her up for mammography on April 04, 2004. She had a palpable mass at the 12 o'clock position in the right breast and by mammogram, this was spiculated without a malignant type microcalcification. The ultrasound showed the mass to be approximately 2.1 cm. Biopsy was performed the same day and showed ([id number]) invasive mammary carcinoma, which was ER/PR and HER-2 negative.    With this  information, the patient was referred to Dr. Marylene Cortez who on January 11 proceed to a right lumpectomy with sentinel lymph node biopsy. That report ([id number]) shows a multifocal tumor, the largest mass being 4.2 cm, two other masses being 1.2 and 0.7 mm, grade 3, with  0 of 2 lymph nodes involved. There was extensive lymphovascular invasion and the margins were positive.  Accordingly, Dr. Annamaria Cortez went back on January 30th and the final margins were negative, but still close at 1 mm superiorly.  There was also extensive LVI noted again in this sample.   Julie Cortez's subsequent history is as detailed below.   PAST MEDICAL HISTORY: Past Medical History:  Diagnosis Date  . Breast cancer [facility]) 2006   right Breast  . Personal history of chemotherapy   . Personal history of radiation therapy   . Thyroid disease    goiter   psoriasis, history of postpartum pulmonary embolus, history of tobacco abuse, history of Clomid use in the late 70s, history of anxiety /depression and history of left breast cyst aspiration.    PAST SURGICAL HISTORY: Past Surgical History:  Procedure Laterality Date  . BREAST LUMPECTOMY  2006   right breast  . CYSTECTOMY  2006  . laproscopic cyst removal  March 2006    FAMILY HISTORY Family History  Problem Relation Age of Onset  . Lung cancer Father        Lung  . Brain cancer Mother        brain  Her father died from widely disseminated cancer at the age of 38. They really do not know what the primary site  was. It was quite widespread at the time of diagnosis and he died within six days of diagnosis. Her mother had superficial bladder cancer and then developed a central nervous system non-Hodgkin's lymphoma.  She died within six months of diagnosis.  The patient has one brother and one sister.  There is no history of breast cancer in the family and no history of ovarian cancer in the family.   GYNECOLOGIC HISTORY: She is G3, P3. First delivery age 2, last menstrual  period 15. She did not have significant problems with hot flashes and never took hormone replacement therapy.    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  Her husband, Julie Cortez, is present today. He owns and runs a Hotel manager. Their three children are Julie Cortez, who lives in [place] and is in Press photographer; Julie Cortez, who lives in [place] and works for [employer]; and Julie Cortez, their son 21 who is junior in [place] and [place] for the school.  She is a member of [employer].     ADVANCED DIRECTIVES: in place   HEALTH MAINTENANCE: Social History   Tobacco Use  . Smoking status: Former Smoker    Quit date: 04/07/2004    Years since quitting: 16.1  Substance Use Topics  . Alcohol use: Yes  . Drug use: No     Colonoscopy:  PAP:  Bone density:  Lipid panel:  No Known Allergies  Current Outpatient Medications  Medication Sig Dispense Refill  . ACITRETIN PO Take by mouth.    Marland Kitchen CALCIUM-VITAMIN D PO Take 500 mg by mouth 2 (two) times daily.      Marland Kitchen levothyroxine (SYNTHROID, LEVOTHROID) 50 MCG tablet Take 50 mcg by mouth daily.       No current facility-administered medications for this visit.    OBJECTIVE: White woman who appears well Vitals:   05/14/20 0859  BP: 115/65  Pulse: 92  Resp: 17  Temp: (!) 97.5 F (36.4 C)  SpO2: 99%     Body mass index is 25.45 kg/m.    ECOG FS: 1  Sclerae unicteric, EOMs intact Wearing a mask No cervical or supraclavicular adenopathy Lungs no rales or rhonchi Heart regular rate and rhythm Abd soft, nontender, positive bowel sounds MSK no focal spinal tenderness, no upper extremity lymphedema Neuro: nonfocal, well oriented, appropriate affect Breasts: Status post right lumpectomy and radiation.  There is no evidence of local recurrence.  The left breast and both axillae are benign.   LAB RESULTS: Lab Results   Component Value Date   WBC 7.0 05/14/2020   NEUTROABS 4.3 05/14/2020   HGB 13.8 05/14/2020   HCT 43.2 05/14/2020   MCV 97.7 05/14/2020   PLT 250 05/14/2020      Chemistry      Component Value Date/Time   NA 141 05/14/2020 0842   NA 144 12/19/2016 1045   K 4.2 05/14/2020 0842   K 4.2 12/19/2016 1045   CL 107 05/14/2020 0842   CL 106 09/21/2012 1503   CO2 28 05/14/2020 0842   CO2 28 12/19/2016 1045   BUN 16 05/14/2020 0842   BUN 16.6 12/19/2016 1045   CREATININE 0.74 05/14/2020 0842   CREATININE 0.76 05/11/2019 1500   CREATININE 0.7 12/19/2016 1045      Component Value Date/Time   CALCIUM 9.1 05/14/2020 0842   CALCIUM 9.5 12/19/2016 1045   ALKPHOS 95 05/14/2020 0842  ALKPHOS 97 12/19/2016 1045   AST 16 05/14/2020 0842   AST 13 (L) 05/11/2019 1500   AST 16 12/19/2016 1045   ALT 11 05/14/2020 0842   ALT 13 05/11/2019 1500   ALT 10 12/19/2016 1045   BILITOT 0.4 05/14/2020 0842   BILITOT 0.4 05/11/2019 1500   BILITOT 0.32 12/19/2016 1045       Lab Results  Component Value Date   LABCA2 25 09/16/2011    No components found for: [id number]  No results for input(s): INR in the last 168 hours.  Urinalysis No results found for: COLORURINE   STUDIES: No results found.   ASSESSMENT: 70 y.o. [place] woman status post right lumpectomy and sentinel lymph node biopsy January 2006 for a T2 N0 grade 3 invasive ductal carcinoma which was triple negative treated with Cytoxan and Adriamycin in dose dense fashion x4 then weekly Taxol x12, completed July 2006, then radiation completed September 2006.     PLAN:  Ilah is now 73 years out from definitive surgery for her breast cancer with no evidence of disease recurrence.  This is very favorable.  At this point I feel comfortable releasing her to her primary care physician.  All she will need in terms of breast cancer follow-up is a yearly mammogram and a yearly physician breast exam  I did give her information on our  survivorship program and if she decides to participate she will let us know  We also discussed genetics testing.  At this point she does not qualify for testing.  We discussed her daughter's breast cancer risk and they do need to standard yearly mammography unless they have density deep breasts in which case they should add a yearly MRI  I will be glad to see Jameria again at any point in the future if and when the need arises but as of now we are making no further routine appointments for her here  Total encounter time 25 minutes.*  Julie Cortez, Julie Dad, MD  05/14/20 11:36 AM Medical Oncology and Hematology [facility] [place], [place] [address] Tel. [phone]    Fax. [phone]   I, Wilburn Mylar, am acting as scribe for Dr. Virgie Cortez. Julie Cortez.  I, Lurline Del MD, have reviewed the above documentation for accuracy and completeness, and I agree with the above.   *Total Encounter Time as defined by the Centers for Medicare and Medicaid Services includes, in addition to the face-to-face time of a patient visit (documented in the note above) non-face-to-face time: obtaining and reviewing outside history, ordering and reviewing medications, tests or procedures, care coordination (communications with other health care professionals or caregivers) and documentation in the medical record.

## 2020-05-14 ENCOUNTER — Inpatient Hospital Stay: Payer: Medicare Other | Attending: Oncology | Admitting: Oncology

## 2020-05-14 ENCOUNTER — Inpatient Hospital Stay: Payer: Medicare Other

## 2020-05-14 ENCOUNTER — Other Ambulatory Visit: Payer: Self-pay

## 2020-05-14 VITALS — BP 115/65 | HR 92 | Temp 97.5°F | Resp 17 | Ht 63.75 in | Wt 147.1 lb

## 2020-05-14 DIAGNOSIS — Z86711 Personal history of pulmonary embolism: Secondary | ICD-10-CM | POA: Diagnosis not present

## 2020-05-14 DIAGNOSIS — Z923 Personal history of irradiation: Secondary | ICD-10-CM | POA: Insufficient documentation

## 2020-05-14 DIAGNOSIS — Z79899 Other long term (current) drug therapy: Secondary | ICD-10-CM | POA: Insufficient documentation

## 2020-05-14 DIAGNOSIS — Z8049 Family history of malignant neoplasm of other genital organs: Secondary | ICD-10-CM | POA: Insufficient documentation

## 2020-05-14 DIAGNOSIS — C50811 Malignant neoplasm of overlapping sites of right female breast: Secondary | ICD-10-CM | POA: Diagnosis not present

## 2020-05-14 DIAGNOSIS — Z87891 Personal history of nicotine dependence: Secondary | ICD-10-CM | POA: Insufficient documentation

## 2020-05-14 DIAGNOSIS — Z171 Estrogen receptor negative status [ER-]: Secondary | ICD-10-CM | POA: Diagnosis not present

## 2020-05-14 DIAGNOSIS — Z9221 Personal history of antineoplastic chemotherapy: Secondary | ICD-10-CM | POA: Insufficient documentation

## 2020-05-14 DIAGNOSIS — Z808 Family history of malignant neoplasm of other organs or systems: Secondary | ICD-10-CM | POA: Diagnosis not present

## 2020-05-14 DIAGNOSIS — Z801 Family history of malignant neoplasm of trachea, bronchus and lung: Secondary | ICD-10-CM | POA: Insufficient documentation

## 2020-05-14 LAB — CBC WITH DIFFERENTIAL/PLATELET
Abs Immature Granulocytes: 0.02 10*3/uL (ref 0.00–0.07)
Basophils Absolute: 0.1 10*3/uL (ref 0.0–0.1)
Basophils Relative: 1 %
Eosinophils Absolute: 0.1 10*3/uL (ref 0.0–0.5)
Eosinophils Relative: 2 %
HCT: 43.2 % (ref 36.0–46.0)
Hemoglobin: 13.8 g/dL (ref 12.0–15.0)
Immature Granulocytes: 0 %
Lymphocytes Relative: 22 %
Lymphs Abs: 1.5 10*3/uL (ref 0.7–4.0)
MCH: 31.2 pg (ref 26.0–34.0)
MCHC: 31.9 g/dL (ref 30.0–36.0)
MCV: 97.7 fL (ref 80.0–100.0)
Monocytes Absolute: 1 10*3/uL (ref 0.1–1.0)
Monocytes Relative: 14 %
Neutro Abs: 4.3 10*3/uL (ref 1.7–7.7)
Neutrophils Relative %: 61 %
Platelets: 250 10*3/uL (ref 150–400)
RBC: 4.42 MIL/uL (ref 3.87–5.11)
RDW: 12.6 % (ref 11.5–15.5)
WBC: 7 10*3/uL (ref 4.0–10.5)
nRBC: 0 % (ref 0.0–0.2)

## 2020-05-14 LAB — COMPREHENSIVE METABOLIC PANEL
ALT: 11 U/L (ref 0–44)
AST: 16 U/L (ref 15–41)
Albumin: 3.6 g/dL (ref 3.5–5.0)
Alkaline Phosphatase: 95 U/L (ref 38–126)
Anion gap: 6 (ref 5–15)
BUN: 16 mg/dL (ref 8–23)
CO2: 28 mmol/L (ref 22–32)
Calcium: 9.1 mg/dL (ref 8.9–10.3)
Chloride: 107 mmol/L (ref 98–111)
Creatinine, Ser: 0.74 mg/dL (ref 0.44–1.00)
GFR, Estimated: >60 mL/min (ref >60)
Glucose, Bld: 103 mg/dL — ABNORMAL HIGH (ref 70–99)
Potassium: 4.2 mmol/L (ref 3.5–5.1)
Sodium: 141 mmol/L (ref 135–145)
Total Bilirubin: 0.4 mg/dL (ref 0.3–1.2)
Total Protein: 7.3 g/dL (ref 6.5–8.1)

## 2020-05-15 ENCOUNTER — Telehealth: Payer: Self-pay | Admitting: Oncology

## 2020-05-15 NOTE — Telephone Encounter (Signed)
No 2/7 los. No changes made to pt's schedule.

## 2020-06-19 DIAGNOSIS — L821 Other seborrheic keratosis: Secondary | ICD-10-CM | POA: Diagnosis not present

## 2020-06-19 DIAGNOSIS — Z85828 Personal history of other malignant neoplasm of skin: Secondary | ICD-10-CM | POA: Diagnosis not present

## 2020-06-19 DIAGNOSIS — L812 Freckles: Secondary | ICD-10-CM | POA: Diagnosis not present

## 2020-06-19 DIAGNOSIS — L400 Psoriasis vulgaris: Secondary | ICD-10-CM | POA: Diagnosis not present

## 2020-06-19 DIAGNOSIS — D225 Melanocytic nevi of trunk: Secondary | ICD-10-CM | POA: Diagnosis not present

## 2020-06-19 DIAGNOSIS — D1801 Hemangioma of skin and subcutaneous tissue: Secondary | ICD-10-CM | POA: Diagnosis not present

## 2020-08-13 ENCOUNTER — Other Ambulatory Visit: Payer: Self-pay | Admitting: Endocrinology

## 2020-08-13 DIAGNOSIS — Z8739 Personal history of other diseases of the musculoskeletal system and connective tissue: Secondary | ICD-10-CM

## 2020-08-13 DIAGNOSIS — Z1231 Encounter for screening mammogram for malignant neoplasm of breast: Secondary | ICD-10-CM

## 2020-09-08 DIAGNOSIS — E876 Hypokalemia: Secondary | ICD-10-CM | POA: Diagnosis not present

## 2020-09-08 DIAGNOSIS — R410 Disorientation, unspecified: Secondary | ICD-10-CM | POA: Diagnosis not present

## 2020-09-14 DIAGNOSIS — R410 Disorientation, unspecified: Secondary | ICD-10-CM | POA: Diagnosis not present

## 2020-09-14 DIAGNOSIS — R7309 Other abnormal glucose: Secondary | ICD-10-CM | POA: Diagnosis not present

## 2020-09-14 DIAGNOSIS — S51819A Laceration without foreign body of unspecified forearm, initial encounter: Secondary | ICD-10-CM | POA: Diagnosis not present

## 2020-09-18 DIAGNOSIS — L400 Psoriasis vulgaris: Secondary | ICD-10-CM | POA: Diagnosis not present

## 2020-09-18 DIAGNOSIS — Z85828 Personal history of other malignant neoplasm of skin: Secondary | ICD-10-CM | POA: Diagnosis not present

## 2020-09-18 DIAGNOSIS — L817 Pigmented purpuric dermatosis: Secondary | ICD-10-CM | POA: Diagnosis not present

## 2020-11-21 DIAGNOSIS — E049 Nontoxic goiter, unspecified: Secondary | ICD-10-CM | POA: Diagnosis not present

## 2020-12-19 DIAGNOSIS — Z85828 Personal history of other malignant neoplasm of skin: Secondary | ICD-10-CM | POA: Diagnosis not present

## 2020-12-19 DIAGNOSIS — C4402 Squamous cell carcinoma of skin of lip: Secondary | ICD-10-CM | POA: Diagnosis not present

## 2020-12-19 DIAGNOSIS — D692 Other nonthrombocytopenic purpura: Secondary | ICD-10-CM | POA: Diagnosis not present

## 2020-12-19 DIAGNOSIS — D485 Neoplasm of uncertain behavior of skin: Secondary | ICD-10-CM | POA: Diagnosis not present

## 2020-12-19 DIAGNOSIS — L400 Psoriasis vulgaris: Secondary | ICD-10-CM | POA: Diagnosis not present

## 2020-12-19 DIAGNOSIS — L722 Steatocystoma multiplex: Secondary | ICD-10-CM | POA: Diagnosis not present

## 2020-12-19 DIAGNOSIS — Z79899 Other long term (current) drug therapy: Secondary | ICD-10-CM | POA: Diagnosis not present

## 2020-12-19 DIAGNOSIS — C44329 Squamous cell carcinoma of skin of other parts of face: Secondary | ICD-10-CM | POA: Diagnosis not present

## 2021-01-15 DIAGNOSIS — Z85828 Personal history of other malignant neoplasm of skin: Secondary | ICD-10-CM | POA: Diagnosis not present

## 2021-01-15 DIAGNOSIS — C4402 Squamous cell carcinoma of skin of lip: Secondary | ICD-10-CM | POA: Diagnosis not present

## 2021-01-22 DIAGNOSIS — L570 Actinic keratosis: Secondary | ICD-10-CM | POA: Diagnosis not present

## 2021-01-22 DIAGNOSIS — L0889 Other specified local infections of the skin and subcutaneous tissue: Secondary | ICD-10-CM | POA: Diagnosis not present

## 2021-01-22 DIAGNOSIS — E049 Nontoxic goiter, unspecified: Secondary | ICD-10-CM | POA: Diagnosis not present

## 2021-01-23 ENCOUNTER — Ambulatory Visit
Admission: RE | Admit: 2021-01-23 | Discharge: 2021-01-23 | Disposition: A | Discharge status: Home / Self Care | Payer: Medicare Other | Source: Ambulatory Visit | Attending: Endocrinology | Admitting: Endocrinology

## 2021-01-23 ENCOUNTER — Other Ambulatory Visit: Payer: Self-pay

## 2021-01-23 DIAGNOSIS — Z1231 Encounter for screening mammogram for malignant neoplasm of breast: Secondary | ICD-10-CM

## 2021-01-23 DIAGNOSIS — M85832 Other specified disorders of bone density and structure, left forearm: Secondary | ICD-10-CM | POA: Diagnosis not present

## 2021-01-23 DIAGNOSIS — Z8739 Personal history of other diseases of the musculoskeletal system and connective tissue: Secondary | ICD-10-CM

## 2021-01-23 DIAGNOSIS — M85852 Other specified disorders of bone density and structure, left thigh: Secondary | ICD-10-CM | POA: Diagnosis not present

## 2021-01-29 DIAGNOSIS — E049 Nontoxic goiter, unspecified: Secondary | ICD-10-CM | POA: Diagnosis not present

## 2021-01-29 DIAGNOSIS — Z808 Family history of malignant neoplasm of other organs or systems: Secondary | ICD-10-CM | POA: Diagnosis not present

## 2021-01-29 DIAGNOSIS — M899 Disorder of bone, unspecified: Secondary | ICD-10-CM | POA: Diagnosis not present

## 2021-03-19 DIAGNOSIS — L812 Freckles: Secondary | ICD-10-CM | POA: Diagnosis not present

## 2021-03-19 DIAGNOSIS — L821 Other seborrheic keratosis: Secondary | ICD-10-CM | POA: Diagnosis not present

## 2021-03-19 DIAGNOSIS — K130 Diseases of lips: Secondary | ICD-10-CM | POA: Diagnosis not present

## 2021-03-19 DIAGNOSIS — L720 Epidermal cyst: Secondary | ICD-10-CM | POA: Diagnosis not present

## 2021-03-19 DIAGNOSIS — L400 Psoriasis vulgaris: Secondary | ICD-10-CM | POA: Diagnosis not present

## 2021-03-19 DIAGNOSIS — Z79899 Other long term (current) drug therapy: Secondary | ICD-10-CM | POA: Diagnosis not present

## 2021-03-19 DIAGNOSIS — Z85828 Personal history of other malignant neoplasm of skin: Secondary | ICD-10-CM | POA: Diagnosis not present

## 2021-03-19 DIAGNOSIS — L570 Actinic keratosis: Secondary | ICD-10-CM | POA: Diagnosis not present

## 2021-03-27 DIAGNOSIS — E559 Vitamin D deficiency, unspecified: Secondary | ICD-10-CM | POA: Diagnosis not present

## 2021-04-24 DIAGNOSIS — Z0000 Encounter for general adult medical examination without abnormal findings: Secondary | ICD-10-CM | POA: Diagnosis not present

## 2021-04-24 DIAGNOSIS — R7303 Prediabetes: Secondary | ICD-10-CM | POA: Diagnosis not present

## 2021-04-24 DIAGNOSIS — E785 Hyperlipidemia, unspecified: Secondary | ICD-10-CM | POA: Diagnosis not present

## 2021-06-12 DIAGNOSIS — Z85828 Personal history of other malignant neoplasm of skin: Secondary | ICD-10-CM | POA: Diagnosis not present

## 2021-06-12 DIAGNOSIS — D485 Neoplasm of uncertain behavior of skin: Secondary | ICD-10-CM | POA: Diagnosis not present

## 2021-06-12 DIAGNOSIS — D0472 Carcinoma in situ of skin of left lower limb, including hip: Secondary | ICD-10-CM | POA: Diagnosis not present

## 2021-06-12 DIAGNOSIS — D0471 Carcinoma in situ of skin of right lower limb, including hip: Secondary | ICD-10-CM | POA: Diagnosis not present

## 2021-06-12 DIAGNOSIS — L400 Psoriasis vulgaris: Secondary | ICD-10-CM | POA: Diagnosis not present

## 2021-06-13 DIAGNOSIS — L400 Psoriasis vulgaris: Secondary | ICD-10-CM | POA: Diagnosis not present

## 2021-06-13 DIAGNOSIS — Z79899 Other long term (current) drug therapy: Secondary | ICD-10-CM | POA: Diagnosis not present

## 2021-08-09 DIAGNOSIS — R051 Acute cough: Secondary | ICD-10-CM | POA: Diagnosis not present

## 2021-08-09 DIAGNOSIS — R519 Headache, unspecified: Secondary | ICD-10-CM | POA: Diagnosis not present

## 2021-08-09 DIAGNOSIS — J019 Acute sinusitis, unspecified: Secondary | ICD-10-CM | POA: Diagnosis not present

## 2021-09-11 DIAGNOSIS — L400 Psoriasis vulgaris: Secondary | ICD-10-CM | POA: Diagnosis not present

## 2021-09-11 DIAGNOSIS — Z85828 Personal history of other malignant neoplasm of skin: Secondary | ICD-10-CM | POA: Diagnosis not present

## 2021-09-11 DIAGNOSIS — L738 Other specified follicular disorders: Secondary | ICD-10-CM | POA: Diagnosis not present

## 2021-09-11 DIAGNOSIS — D485 Neoplasm of uncertain behavior of skin: Secondary | ICD-10-CM | POA: Diagnosis not present

## 2021-09-11 DIAGNOSIS — D1801 Hemangioma of skin and subcutaneous tissue: Secondary | ICD-10-CM | POA: Diagnosis not present

## 2021-09-11 DIAGNOSIS — D225 Melanocytic nevi of trunk: Secondary | ICD-10-CM | POA: Diagnosis not present

## 2021-09-11 DIAGNOSIS — C44729 Squamous cell carcinoma of skin of left lower limb, including hip: Secondary | ICD-10-CM | POA: Diagnosis not present

## 2021-09-11 DIAGNOSIS — L821 Other seborrheic keratosis: Secondary | ICD-10-CM | POA: Diagnosis not present

## 2021-09-11 DIAGNOSIS — D692 Other nonthrombocytopenic purpura: Secondary | ICD-10-CM | POA: Diagnosis not present

## 2021-09-11 DIAGNOSIS — Z79899 Other long term (current) drug therapy: Secondary | ICD-10-CM | POA: Diagnosis not present

## 2021-11-13 DIAGNOSIS — D3131 Benign neoplasm of right choroid: Secondary | ICD-10-CM | POA: Diagnosis not present

## 2021-11-13 DIAGNOSIS — H04123 Dry eye syndrome of bilateral lacrimal glands: Secondary | ICD-10-CM | POA: Diagnosis not present

## 2021-11-13 DIAGNOSIS — H2513 Age-related nuclear cataract, bilateral: Secondary | ICD-10-CM | POA: Diagnosis not present

## 2021-11-13 DIAGNOSIS — D3132 Benign neoplasm of left choroid: Secondary | ICD-10-CM | POA: Diagnosis not present

## 2021-11-13 DIAGNOSIS — H40013 Open angle with borderline findings, low risk, bilateral: Secondary | ICD-10-CM | POA: Diagnosis not present

## 2021-12-05 IMAGING — MG MM DIGITAL SCREENING BILAT W/ TOMO AND CAD
8 series · 8 of 24 positions shown · non-contrast
Comparison: Previous exam(s).

ACR Breast Density Category a: The breast tissue is almost entirely
fatty.

CLINICAL DATA: Screening. Personal history of malignant RIGHT
breast lumpectomy in 1221.

EXAM:
DIGITAL SCREENING BILATERAL MAMMOGRAM WITH TOMOSYNTHESIS AND CAD
TECHNIQUE: Bilateral screening digital craniocaudal and mediolateral oblique
mammograms were obtained. Bilateral screening digital breast
tomosynthesis was performed. The images were evaluated with
computer-aided detection.

[R MLO synth-2D]
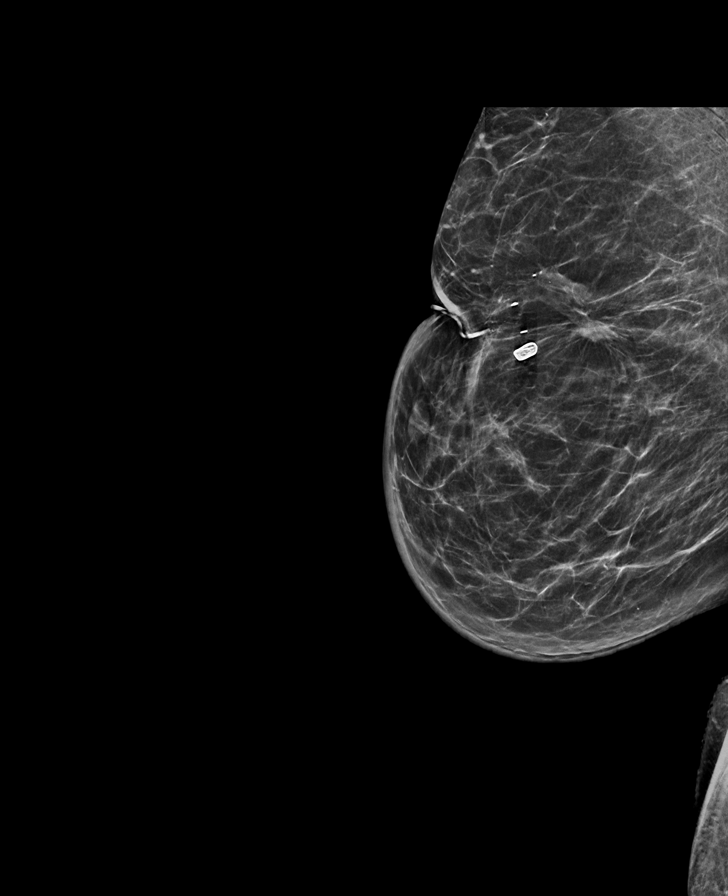

[L CC synth-2D]
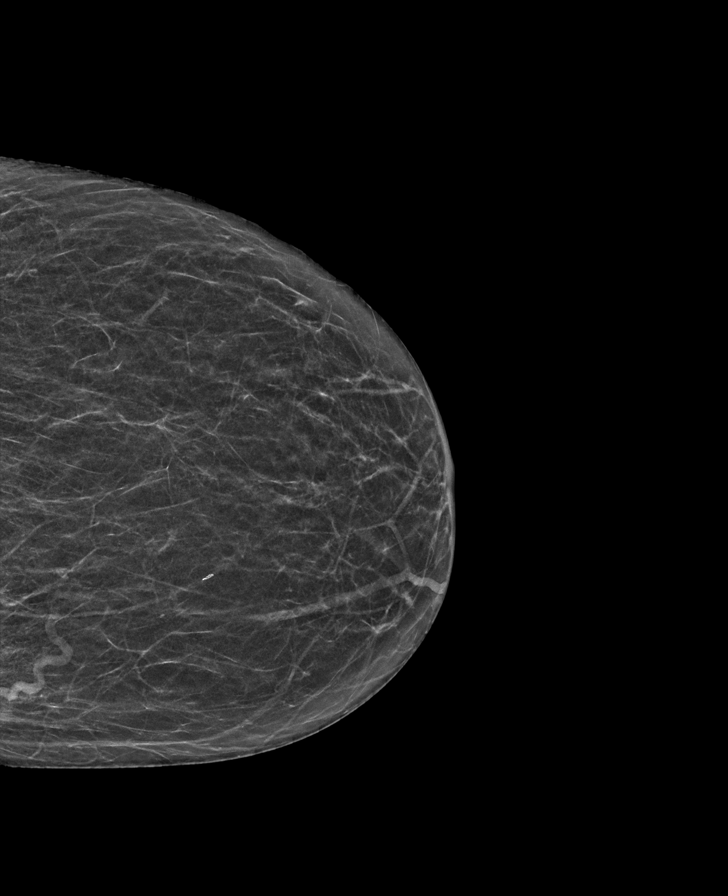

[R CC synth-2D]
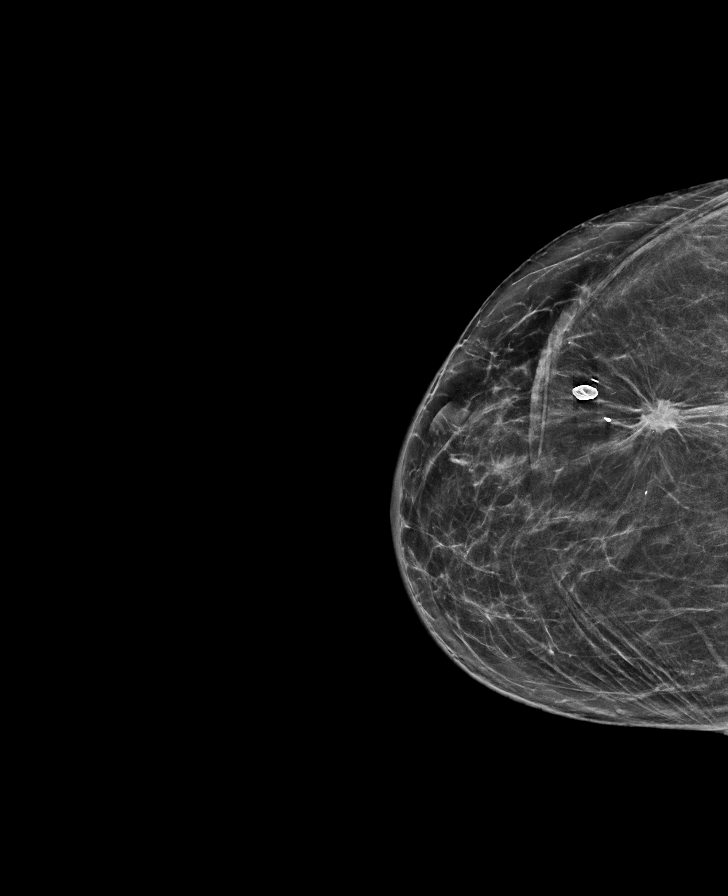

[L MLO synth-2D]
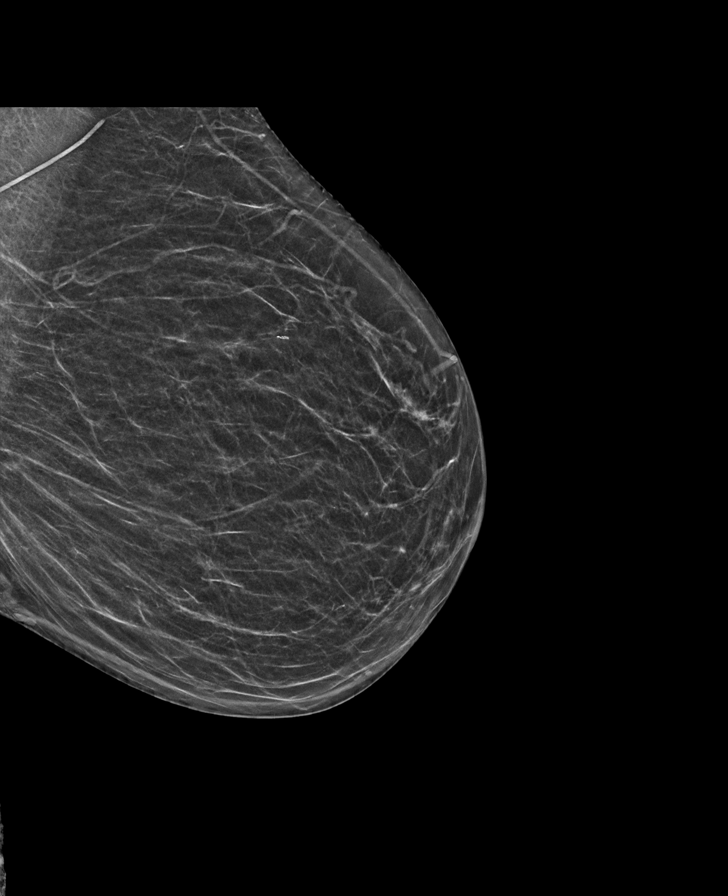

[L CC tomo · tomo slice 23/46.0]
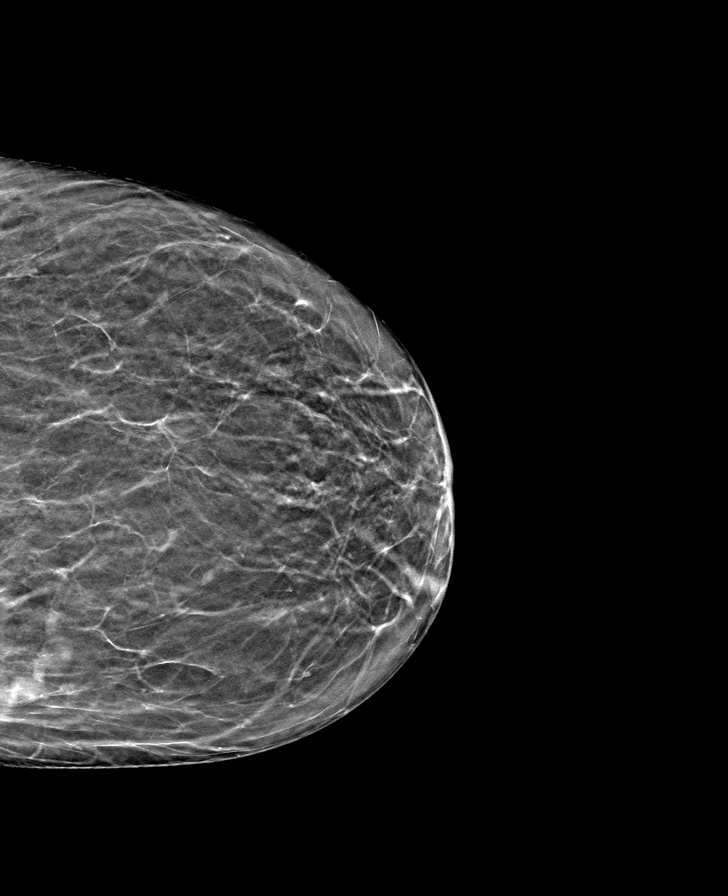

[L MLO tomo · tomo slice 25/49.0]
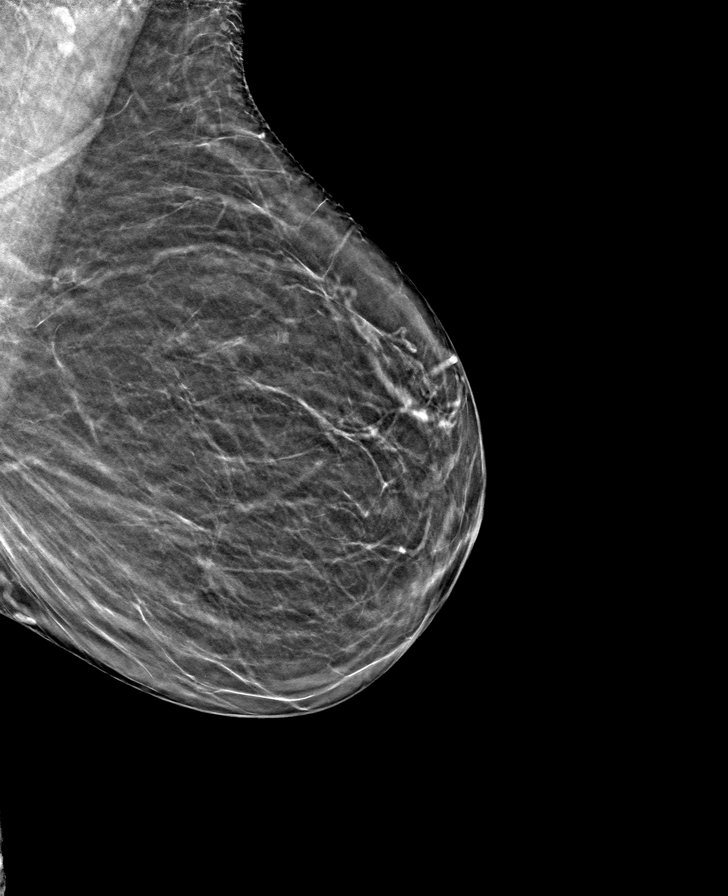

[R MLO tomo · tomo slice 31/62.0]
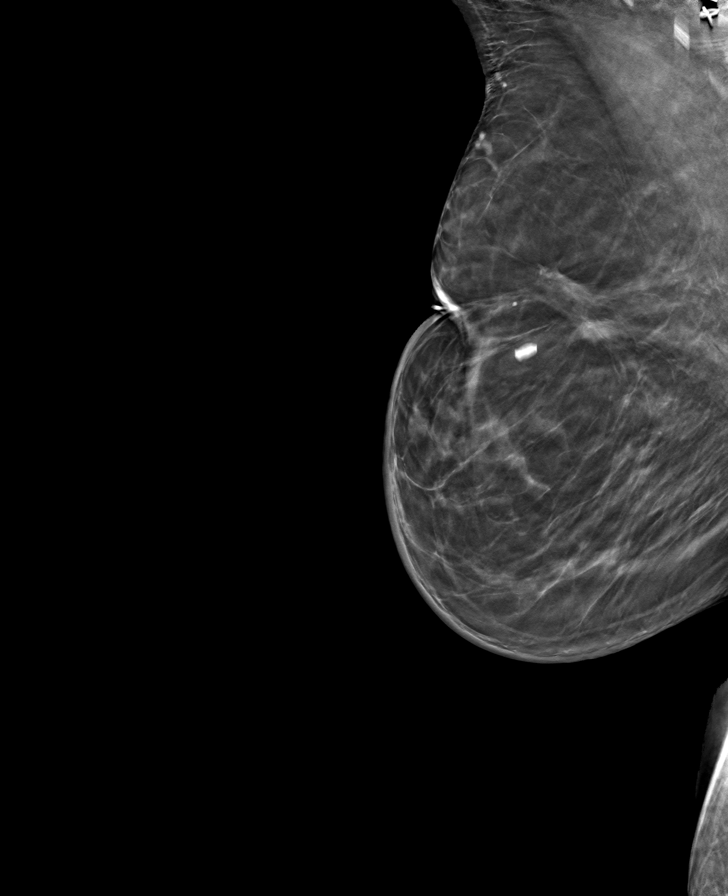

[R CC tomo · tomo slice 33/64.0]
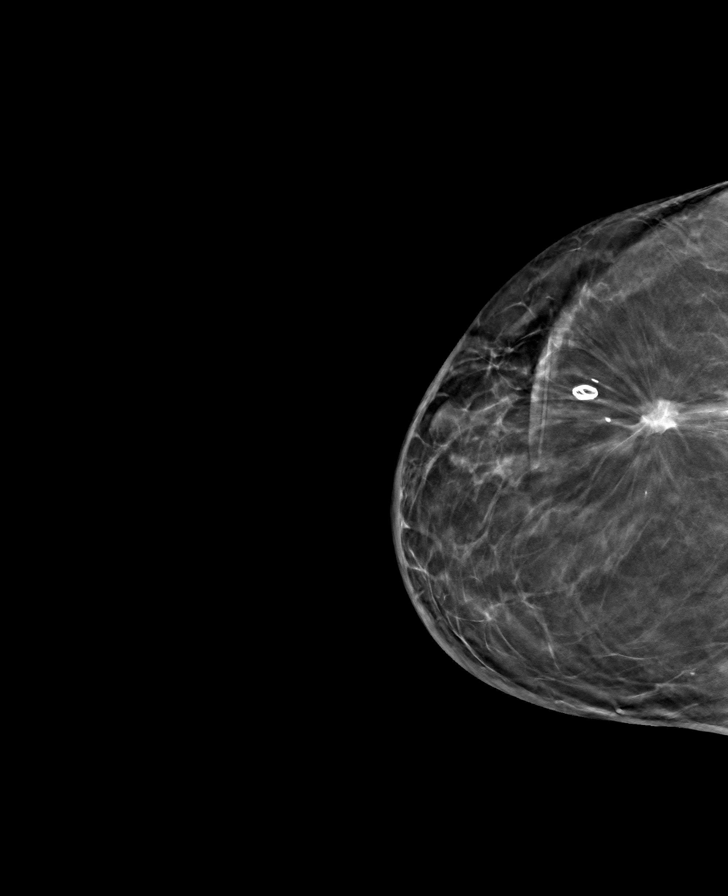

[8 of 24 positions shown; findings below may reference images not displayed]

FINDINGS: There are no findings suspicious for malignancy. Expected post
lumpectomy changes involving the RIGHT breast.
IMPRESSION: No mammographic evidence of malignancy. A result letter of this
screening mammogram will be mailed directly to the patient.

RECOMMENDATION:
Screening mammogram in one year. (Code:K1-N-KKZ)

BI-RADS CATEGORY  2: Benign.

## 2021-12-23 DIAGNOSIS — Z79899 Other long term (current) drug therapy: Secondary | ICD-10-CM | POA: Diagnosis not present

## 2021-12-23 DIAGNOSIS — L812 Freckles: Secondary | ICD-10-CM | POA: Diagnosis not present

## 2021-12-23 DIAGNOSIS — Z85828 Personal history of other malignant neoplasm of skin: Secondary | ICD-10-CM | POA: Diagnosis not present

## 2021-12-23 DIAGNOSIS — L720 Epidermal cyst: Secondary | ICD-10-CM | POA: Diagnosis not present

## 2021-12-23 DIAGNOSIS — L400 Psoriasis vulgaris: Secondary | ICD-10-CM | POA: Diagnosis not present

## 2022-01-24 ENCOUNTER — Other Ambulatory Visit: Payer: Self-pay | Admitting: Endocrinology

## 2022-01-24 DIAGNOSIS — Z1231 Encounter for screening mammogram for malignant neoplasm of breast: Secondary | ICD-10-CM

## 2022-03-17 DIAGNOSIS — L812 Freckles: Secondary | ICD-10-CM | POA: Diagnosis not present

## 2022-03-17 DIAGNOSIS — Z79899 Other long term (current) drug therapy: Secondary | ICD-10-CM | POA: Diagnosis not present

## 2022-03-17 DIAGNOSIS — D1801 Hemangioma of skin and subcutaneous tissue: Secondary | ICD-10-CM | POA: Diagnosis not present

## 2022-03-17 DIAGNOSIS — L570 Actinic keratosis: Secondary | ICD-10-CM | POA: Diagnosis not present

## 2022-03-17 DIAGNOSIS — L821 Other seborrheic keratosis: Secondary | ICD-10-CM | POA: Diagnosis not present

## 2022-03-17 DIAGNOSIS — Z85828 Personal history of other malignant neoplasm of skin: Secondary | ICD-10-CM | POA: Diagnosis not present

## 2022-03-17 DIAGNOSIS — L400 Psoriasis vulgaris: Secondary | ICD-10-CM | POA: Diagnosis not present

## 2022-03-18 ENCOUNTER — Ambulatory Visit
Admission: RE | Admit: 2022-03-18 | Discharge: 2022-03-18 | Disposition: A | Discharge status: Home / Self Care | Payer: Medicare Other | Source: Ambulatory Visit | Attending: Endocrinology | Admitting: Endocrinology

## 2022-03-18 DIAGNOSIS — Z1231 Encounter for screening mammogram for malignant neoplasm of breast: Secondary | ICD-10-CM

## 2022-04-28 DIAGNOSIS — J309 Allergic rhinitis, unspecified: Secondary | ICD-10-CM | POA: Diagnosis not present

## 2022-04-28 DIAGNOSIS — E782 Mixed hyperlipidemia: Secondary | ICD-10-CM | POA: Diagnosis not present

## 2022-04-28 DIAGNOSIS — Z853 Personal history of malignant neoplasm of breast: Secondary | ICD-10-CM | POA: Diagnosis not present

## 2022-04-28 DIAGNOSIS — R7303 Prediabetes: Secondary | ICD-10-CM | POA: Diagnosis not present

## 2022-04-28 DIAGNOSIS — L409 Psoriasis, unspecified: Secondary | ICD-10-CM | POA: Diagnosis not present

## 2022-04-28 DIAGNOSIS — Z0000 Encounter for general adult medical examination without abnormal findings: Secondary | ICD-10-CM | POA: Diagnosis not present

## 2022-04-28 DIAGNOSIS — E049 Nontoxic goiter, unspecified: Secondary | ICD-10-CM | POA: Diagnosis not present

## 2022-06-25 DIAGNOSIS — Z85828 Personal history of other malignant neoplasm of skin: Secondary | ICD-10-CM | POA: Diagnosis not present

## 2022-06-25 DIAGNOSIS — L400 Psoriasis vulgaris: Secondary | ICD-10-CM | POA: Diagnosis not present

## 2022-06-25 DIAGNOSIS — Z79899 Other long term (current) drug therapy: Secondary | ICD-10-CM | POA: Diagnosis not present

## 2022-06-25 DIAGNOSIS — L812 Freckles: Secondary | ICD-10-CM | POA: Diagnosis not present

## 2022-10-21 DIAGNOSIS — Z85828 Personal history of other malignant neoplasm of skin: Secondary | ICD-10-CM | POA: Diagnosis not present

## 2022-10-21 DIAGNOSIS — L400 Psoriasis vulgaris: Secondary | ICD-10-CM | POA: Diagnosis not present

## 2022-10-21 DIAGNOSIS — Z79899 Other long term (current) drug therapy: Secondary | ICD-10-CM | POA: Diagnosis not present

## 2022-10-21 DIAGNOSIS — L812 Freckles: Secondary | ICD-10-CM | POA: Diagnosis not present

## 2022-10-21 DIAGNOSIS — D485 Neoplasm of uncertain behavior of skin: Secondary | ICD-10-CM | POA: Diagnosis not present

## 2022-10-21 DIAGNOSIS — C44629 Squamous cell carcinoma of skin of left upper limb, including shoulder: Secondary | ICD-10-CM | POA: Diagnosis not present

## 2023-01-20 DIAGNOSIS — Z23 Encounter for immunization: Secondary | ICD-10-CM | POA: Diagnosis not present

## 2023-01-21 ENCOUNTER — Other Ambulatory Visit: Payer: Self-pay | Admitting: Endocrinology

## 2023-01-21 DIAGNOSIS — Z1231 Encounter for screening mammogram for malignant neoplasm of breast: Secondary | ICD-10-CM

## 2023-01-22 DIAGNOSIS — D1801 Hemangioma of skin and subcutaneous tissue: Secondary | ICD-10-CM | POA: Diagnosis not present

## 2023-01-22 DIAGNOSIS — D224 Melanocytic nevi of scalp and neck: Secondary | ICD-10-CM | POA: Diagnosis not present

## 2023-01-22 DIAGNOSIS — L400 Psoriasis vulgaris: Secondary | ICD-10-CM | POA: Diagnosis not present

## 2023-01-22 DIAGNOSIS — Z85828 Personal history of other malignant neoplasm of skin: Secondary | ICD-10-CM | POA: Diagnosis not present

## 2023-01-22 DIAGNOSIS — Z79899 Other long term (current) drug therapy: Secondary | ICD-10-CM | POA: Diagnosis not present

## 2023-01-22 DIAGNOSIS — L812 Freckles: Secondary | ICD-10-CM | POA: Diagnosis not present

## 2023-02-18 ENCOUNTER — Other Ambulatory Visit: Payer: Self-pay | Admitting: Endocrinology

## 2023-02-18 DIAGNOSIS — M899 Disorder of bone, unspecified: Secondary | ICD-10-CM

## 2023-03-24 ENCOUNTER — Ambulatory Visit: Payer: Medicare Other

## 2023-04-21 ENCOUNTER — Ambulatory Visit
Admission: RE | Admit: 2023-04-21 | Discharge: 2023-04-21 | Disposition: A | Discharge status: Home / Self Care | Payer: Medicare Other | Source: Ambulatory Visit | Attending: Endocrinology | Admitting: Endocrinology

## 2023-04-21 DIAGNOSIS — Z1231 Encounter for screening mammogram for malignant neoplasm of breast: Secondary | ICD-10-CM | POA: Diagnosis not present

## 2023-05-04 DIAGNOSIS — D485 Neoplasm of uncertain behavior of skin: Secondary | ICD-10-CM | POA: Diagnosis not present

## 2023-05-04 DIAGNOSIS — Z79899 Other long term (current) drug therapy: Secondary | ICD-10-CM | POA: Diagnosis not present

## 2023-05-04 DIAGNOSIS — Z85828 Personal history of other malignant neoplasm of skin: Secondary | ICD-10-CM | POA: Diagnosis not present

## 2023-05-04 DIAGNOSIS — L812 Freckles: Secondary | ICD-10-CM | POA: Diagnosis not present

## 2023-05-04 DIAGNOSIS — L821 Other seborrheic keratosis: Secondary | ICD-10-CM | POA: Diagnosis not present

## 2023-05-04 DIAGNOSIS — L400 Psoriasis vulgaris: Secondary | ICD-10-CM | POA: Diagnosis not present

## 2023-05-04 DIAGNOSIS — D1801 Hemangioma of skin and subcutaneous tissue: Secondary | ICD-10-CM | POA: Diagnosis not present

## 2023-05-04 DIAGNOSIS — C44722 Squamous cell carcinoma of skin of right lower limb, including hip: Secondary | ICD-10-CM | POA: Diagnosis not present

## 2023-07-20 DIAGNOSIS — E049 Nontoxic goiter, unspecified: Secondary | ICD-10-CM | POA: Diagnosis not present

## 2023-07-20 DIAGNOSIS — M8580 Other specified disorders of bone density and structure, unspecified site: Secondary | ICD-10-CM | POA: Diagnosis not present

## 2023-07-20 DIAGNOSIS — R7303 Prediabetes: Secondary | ICD-10-CM | POA: Diagnosis not present

## 2023-07-20 DIAGNOSIS — E782 Mixed hyperlipidemia: Secondary | ICD-10-CM | POA: Diagnosis not present

## 2023-07-20 DIAGNOSIS — M8588 Other specified disorders of bone density and structure, other site: Secondary | ICD-10-CM | POA: Diagnosis not present

## 2023-07-20 DIAGNOSIS — Z130 Encounter for screening for diseases of the blood and blood-forming organs and certain disorders involving the immune mechanism: Secondary | ICD-10-CM | POA: Diagnosis not present

## 2023-07-22 DIAGNOSIS — E782 Mixed hyperlipidemia: Secondary | ICD-10-CM | POA: Diagnosis not present

## 2023-07-22 DIAGNOSIS — K5900 Constipation, unspecified: Secondary | ICD-10-CM | POA: Diagnosis not present

## 2023-07-22 DIAGNOSIS — M8588 Other specified disorders of bone density and structure, other site: Secondary | ICD-10-CM | POA: Diagnosis not present

## 2023-07-22 DIAGNOSIS — L409 Psoriasis, unspecified: Secondary | ICD-10-CM | POA: Diagnosis not present

## 2023-07-22 DIAGNOSIS — Z130 Encounter for screening for diseases of the blood and blood-forming organs and certain disorders involving the immune mechanism: Secondary | ICD-10-CM | POA: Diagnosis not present

## 2023-07-22 DIAGNOSIS — Z0000 Encounter for general adult medical examination without abnormal findings: Secondary | ICD-10-CM | POA: Diagnosis not present

## 2023-07-22 DIAGNOSIS — E049 Nontoxic goiter, unspecified: Secondary | ICD-10-CM | POA: Diagnosis not present

## 2023-07-22 DIAGNOSIS — Z1211 Encounter for screening for malignant neoplasm of colon: Secondary | ICD-10-CM | POA: Diagnosis not present

## 2023-07-22 DIAGNOSIS — R7303 Prediabetes: Secondary | ICD-10-CM | POA: Diagnosis not present

## 2023-08-26 DIAGNOSIS — L308 Other specified dermatitis: Secondary | ICD-10-CM | POA: Diagnosis not present

## 2023-08-26 DIAGNOSIS — D0462 Carcinoma in situ of skin of left upper limb, including shoulder: Secondary | ICD-10-CM | POA: Diagnosis not present

## 2023-08-26 DIAGNOSIS — L570 Actinic keratosis: Secondary | ICD-10-CM | POA: Diagnosis not present

## 2023-08-26 DIAGNOSIS — Z85828 Personal history of other malignant neoplasm of skin: Secondary | ICD-10-CM | POA: Diagnosis not present

## 2023-08-26 DIAGNOSIS — L400 Psoriasis vulgaris: Secondary | ICD-10-CM | POA: Diagnosis not present

## 2023-08-26 DIAGNOSIS — D692 Other nonthrombocytopenic purpura: Secondary | ICD-10-CM | POA: Diagnosis not present

## 2023-08-26 DIAGNOSIS — D1801 Hemangioma of skin and subcutaneous tissue: Secondary | ICD-10-CM | POA: Diagnosis not present

## 2023-08-26 DIAGNOSIS — D485 Neoplasm of uncertain behavior of skin: Secondary | ICD-10-CM | POA: Diagnosis not present

## 2023-08-26 DIAGNOSIS — L718 Other rosacea: Secondary | ICD-10-CM | POA: Diagnosis not present

## 2023-08-26 DIAGNOSIS — L812 Freckles: Secondary | ICD-10-CM | POA: Diagnosis not present

## 2023-09-16 ENCOUNTER — Other Ambulatory Visit: Payer: Self-pay | Admitting: Endocrinology

## 2023-09-16 DIAGNOSIS — M899 Disorder of bone, unspecified: Secondary | ICD-10-CM | POA: Diagnosis not present

## 2023-09-16 DIAGNOSIS — E039 Hypothyroidism, unspecified: Secondary | ICD-10-CM | POA: Diagnosis not present

## 2023-09-16 DIAGNOSIS — E559 Vitamin D deficiency, unspecified: Secondary | ICD-10-CM | POA: Diagnosis not present

## 2023-09-16 DIAGNOSIS — E049 Nontoxic goiter, unspecified: Secondary | ICD-10-CM | POA: Diagnosis not present

## 2023-09-16 DIAGNOSIS — Z808 Family history of malignant neoplasm of other organs or systems: Secondary | ICD-10-CM | POA: Diagnosis not present

## 2023-09-21 ENCOUNTER — Ambulatory Visit
Admission: RE | Admit: 2023-09-21 | Discharge: 2023-09-21 | Disposition: A | Discharge status: Home / Self Care | Source: Ambulatory Visit | Attending: Endocrinology | Admitting: Endocrinology

## 2023-09-21 DIAGNOSIS — E049 Nontoxic goiter, unspecified: Secondary | ICD-10-CM

## 2023-09-21 DIAGNOSIS — E041 Nontoxic single thyroid nodule: Secondary | ICD-10-CM | POA: Diagnosis not present

## 2023-10-14 ENCOUNTER — Other Ambulatory Visit: Payer: Medicare Other

## 2023-11-17 DIAGNOSIS — H2513 Age-related nuclear cataract, bilateral: Secondary | ICD-10-CM | POA: Diagnosis not present

## 2023-11-17 DIAGNOSIS — D3131 Benign neoplasm of right choroid: Secondary | ICD-10-CM | POA: Diagnosis not present

## 2023-11-17 DIAGNOSIS — H04123 Dry eye syndrome of bilateral lacrimal glands: Secondary | ICD-10-CM | POA: Diagnosis not present

## 2023-11-17 DIAGNOSIS — D3132 Benign neoplasm of left choroid: Secondary | ICD-10-CM | POA: Diagnosis not present

## 2023-11-17 DIAGNOSIS — H43813 Vitreous degeneration, bilateral: Secondary | ICD-10-CM | POA: Diagnosis not present

## 2023-11-17 DIAGNOSIS — H40013 Open angle with borderline findings, low risk, bilateral: Secondary | ICD-10-CM | POA: Diagnosis not present

## 2023-12-10 DIAGNOSIS — M85851 Other specified disorders of bone density and structure, right thigh: Secondary | ICD-10-CM | POA: Diagnosis not present

## 2023-12-10 DIAGNOSIS — M85852 Other specified disorders of bone density and structure, left thigh: Secondary | ICD-10-CM | POA: Diagnosis not present

## 2023-12-17 DIAGNOSIS — L659 Nonscarring hair loss, unspecified: Secondary | ICD-10-CM | POA: Diagnosis not present

## 2023-12-17 DIAGNOSIS — Z79899 Other long term (current) drug therapy: Secondary | ICD-10-CM | POA: Diagnosis not present

## 2023-12-17 DIAGNOSIS — Z85828 Personal history of other malignant neoplasm of skin: Secondary | ICD-10-CM | POA: Diagnosis not present

## 2023-12-17 DIAGNOSIS — L812 Freckles: Secondary | ICD-10-CM | POA: Diagnosis not present

## 2023-12-17 DIAGNOSIS — D1801 Hemangioma of skin and subcutaneous tissue: Secondary | ICD-10-CM | POA: Diagnosis not present

## 2023-12-17 DIAGNOSIS — D692 Other nonthrombocytopenic purpura: Secondary | ICD-10-CM | POA: Diagnosis not present

## 2023-12-17 DIAGNOSIS — L400 Psoriasis vulgaris: Secondary | ICD-10-CM | POA: Diagnosis not present

## 2023-12-17 DIAGNOSIS — L649 Androgenic alopecia, unspecified: Secondary | ICD-10-CM | POA: Diagnosis not present

## 2024-02-18 ENCOUNTER — Other Ambulatory Visit: Payer: Self-pay

## 2024-02-18 ENCOUNTER — Emergency Department (HOSPITAL_COMMUNITY): Admission: EM | Admit: 2024-02-18 | Discharge: 2024-02-18 | Disposition: A | Discharge status: Home / Self Care

## 2024-02-18 DIAGNOSIS — Y9301 Activity, walking, marching and hiking: Secondary | ICD-10-CM | POA: Insufficient documentation

## 2024-02-18 DIAGNOSIS — Y92007 Garden or yard of unspecified non-institutional (private) residence as the place of occurrence of the external cause: Secondary | ICD-10-CM | POA: Insufficient documentation

## 2024-02-18 DIAGNOSIS — W208XXA Other cause of strike by thrown, projected or falling object, initial encounter: Secondary | ICD-10-CM | POA: Insufficient documentation

## 2024-02-18 DIAGNOSIS — S81812A Laceration without foreign body, left lower leg, initial encounter: Secondary | ICD-10-CM | POA: Diagnosis not present

## 2024-02-18 MED ORDER — LIDOCAINE-EPINEPHRINE-TETRACAINE (LET) TOPICAL GEL
3.0000 mL | Freq: Once | TOPICAL | Status: AC
  Administered 2024-02-18: 3 mL via TOPICAL
  Filled 2024-02-18: qty 3

## 2024-02-18 NOTE — Discharge Instructions (Signed)
 Keep your bandage in place for the next 24 hours Do not scratch, rub, or pick at the adhesive. Leave tissue adhesive in place. It will come off naturally after 7-10 days. Do not place tape over the adhesive. The adhesive could come off the wound when you pull the tape off. Protect the wound from further injury until it is healed. Check your wound area every day for signs of infection. Check for: More redness, swelling, or pain,Fluid or blood,Warmth, Pus or a bad smell. Do not take baths, swim, or use a hot tub until your health care provider approves. You may only be allowed to take sponge baths. Ask your health care provider if you may take showers.You can usually shower after the first 24 hours. Cover the dressing with a watertight covering when you take a shower. Do not soak the area where there is tissue adhesive. Do not use any soaps, petroleum jelly products, or ointments on the wound. Certain ointments can weaken the adhesive.

## 2024-02-18 NOTE — ED Triage Notes (Signed)
 Patient reports she was working in the yard and a piece of wood fell on her her leg. Left lower leg has a laceration that is about 1-2in long. Patient is holding pressure and elevating the leg to control bleeding on arrival. She denies blood thinners.

## 2024-02-18 NOTE — ED Provider Notes (Signed)
 [facility] Provider Note   CSN: [id number] Arrival date & time: 02/18/24  1225     Patient presents with: Leg Injury and Laceration (left)   Julie Cortez is a 73 y.o. female who presents for laceration of her left lower extremity.  This is a second laceration she has got within the week.  She dropped a log on the medial part of her left inner leg and her husband was outside and noticed it was bleeding heavily so cleaned it up bandaged her and brought her in.  Bleeding has resolved at this time she is up-to-date on her tetanus vaccination she has no neuro complaints or weakness.    Laceration      Prior to Admission medications   Medication Sig Start Date End Date Taking? Authorizing Provider  ACITRETIN PO Take by mouth.    [provider]  CALCIUM-VITAMIN D  PO Take 500 mg by mouth 2 (two) times daily.      [provider]  levothyroxine (SYNTHROID, LEVOTHROID) 50 MCG tablet Take 50 mcg by mouth daily.      [provider]    Allergies: Patient has no known allergies.    Review of Systems  Updated Vital Signs BP 129/78 (BP Location: Left Arm)   Pulse (!) 105   Temp (!) 97.5 F (36.4 C)   Resp 18   Ht 5' 2 (1.575 m)   Wt 54.4 kg   SpO2 98%   BMI 21.95 kg/m   Physical Exam Vitals and nursing note reviewed.  Constitutional:      General: She is not in acute distress.    Appearance: She is well-developed. She is not diaphoretic.  HENT:     Head: Normocephalic and atraumatic.     Right Ear: External ear normal.     Left Ear: External ear normal.     Nose: Nose normal.     Mouth/Throat:     Mouth: Mucous membranes are moist.  Eyes:     General: No scleral icterus.    Conjunctiva/sclera: Conjunctivae normal.  Cardiovascular:     Rate and Rhythm: Normal rate and regular rhythm.     Heart sounds: Normal heart sounds. No murmur heard.    No friction rub. No gallop.  Pulmonary:      Effort: Pulmonary effort is normal. No respiratory distress.     Breath sounds: Normal breath sounds.  Abdominal:     General: Bowel sounds are normal. There is no distension.     Palpations: Abdomen is soft. There is no mass.     Tenderness: There is no abdominal tenderness. There is no guarding.  Musculoskeletal:     Cervical back: Normal range of motion.  Skin:    General: Skin is warm and dry.     Comments: Patient 4 cm square shaped laceration  Neurological:     Mental Status: She is alert and oriented to person, place, and time.  Psychiatric:        Behavior: Behavior normal.     (all labs ordered are listed, but only abnormal results are displayed) Labs Reviewed - No data to display  EKG: None  Radiology: No results found.   Wound closure utilizing adhes only  Date/Time: 02/18/2024 2:58 PM  Performed by: Arloa Chroman, PA-C Authorized by: Arloa Chroman, PA-C  Consent: Verbal consent obtained Risks and benefits: risks, benefits and alternatives were discussed Patient identity confirmed: verbally with patient Time out: Immediately prior  to procedure a time out was called to verify the correct patient, procedure, equipment, support staff and site/side marked as required. Preparation: Patient was prepped and draped in the usual sterile fashion. Local anesthesia used: Let.  Anesthesia: Local anesthesia used: Let.  Sedation: Patient sedated: no (3 Steri-Strips utilized)       Medications Ordered in the ED  lidocaine-EPINEPHrine-tetracaine (LET) topical gel (has no administration in time range)                                    Medical Decision Making   Tdap up-to-date pressure irrigation performed. Laceration occurred < 8 hours prior to repair which was well tolerated. Pt has no co morbidities to effect normal wound healing. Discussed suture home care w pt and answered questions.  Tissue adhesive utilized, follow-up as needed . Pt is hemodynamically  stable w no complaints prior to dc.        Final diagnoses:  None    ED Discharge Orders     None          Arloa Chroman, PA-C 02/18/24 1459    Ula Prentice SAUNDERS, MD 02/18/24 [phone]
